# Patient Record
Sex: Female | Born: 1990 | Race: Asian | Hispanic: No | Smoking: Current every day smoker
Health system: Southern US, Community
[De-identification: ages and names within clinical notes are randomized; demographics above are authoritative.]

## PROBLEM LIST (undated history)

## (undated) DIAGNOSIS — G43909 Migraine, unspecified, not intractable, without status migrainosus: Secondary | ICD-10-CM

## (undated) DIAGNOSIS — Z862 Personal history of diseases of the blood and blood-forming organs and certain disorders involving the immune mechanism: Secondary | ICD-10-CM

## (undated) DIAGNOSIS — N200 Calculus of kidney: Secondary | ICD-10-CM

## (undated) DIAGNOSIS — B009 Herpesviral infection, unspecified: Secondary | ICD-10-CM

## (undated) HISTORY — DX: Migraine, unspecified, not intractable, without status migrainosus: G43.909

## (undated) HISTORY — PX: OTHER SURGICAL HISTORY: SHX169

## (undated) HISTORY — DX: Herpesviral infection, unspecified: B00.9

## (undated) HISTORY — DX: Calculus of kidney: N20.0

## (undated) HISTORY — PX: CHOLECYSTECTOMY: SHX55

## (undated) HISTORY — DX: Personal history of diseases of the blood and blood-forming organs and certain disorders involving the immune mechanism: Z86.2

---

## 2009-02-13 ENCOUNTER — Emergency Department: Payer: Self-pay | Admitting: Internal Medicine

## 2010-05-21 ENCOUNTER — Emergency Department: Payer: Self-pay | Admitting: Emergency Medicine

## 2011-02-13 ENCOUNTER — Emergency Department: Payer: Self-pay | Admitting: Emergency Medicine

## 2011-03-18 ENCOUNTER — Ambulatory Visit: Payer: Self-pay | Admitting: Obstetrics & Gynecology

## 2012-04-12 ENCOUNTER — Emergency Department: Payer: Self-pay | Admitting: Emergency Medicine

## 2013-01-20 ENCOUNTER — Emergency Department: Payer: Self-pay | Admitting: Emergency Medicine

## 2013-01-20 LAB — URINALYSIS, COMPLETE
Bacteria: NONE SEEN
Glucose,UR: NEGATIVE mg/dL (ref 0–75)
Ketone: NEGATIVE
Nitrite: NEGATIVE
Ph: 5 (ref 4.5–8.0)
Protein: 30
Specific Gravity: 1.025 (ref 1.003–1.030)
WBC UR: 16 /HPF (ref 0–5)

## 2014-03-11 ENCOUNTER — Emergency Department: Payer: Self-pay | Admitting: Emergency Medicine

## 2014-03-11 LAB — CBC
HCT: 35.6 % (ref 35.0–47.0)
HGB: 11.5 g/dL — AB (ref 12.0–16.0)
MCH: 20.4 pg — ABNORMAL LOW (ref 26.0–34.0)
MCHC: 32.3 g/dL (ref 32.0–36.0)
MCV: 63 fL — ABNORMAL LOW (ref 80–100)
PLATELETS: 291 10*3/uL (ref 150–440)
RBC: 5.65 10*6/uL — ABNORMAL HIGH (ref 3.80–5.20)
RDW: 15.7 % — AB (ref 11.5–14.5)
WBC: 15.5 10*3/uL — ABNORMAL HIGH (ref 3.6–11.0)

## 2014-03-11 LAB — BASIC METABOLIC PANEL
ANION GAP: 7 (ref 7–16)
BUN: 8 mg/dL (ref 7–18)
CALCIUM: 8.7 mg/dL (ref 8.5–10.1)
CHLORIDE: 105 mmol/L (ref 98–107)
CREATININE: 0.63 mg/dL (ref 0.60–1.30)
Co2: 26 mmol/L (ref 21–32)
EGFR (Non-African Amer.): >60
Glucose: 89 mg/dL (ref 65–99)
OSMOLALITY: 273 (ref 275–301)
POTASSIUM: 3.8 mmol/L (ref 3.5–5.1)
Sodium: 138 mmol/L (ref 136–145)

## 2014-03-11 LAB — TROPONIN I

## 2015-05-02 ENCOUNTER — Encounter: Payer: Self-pay | Admitting: Emergency Medicine

## 2015-05-02 DIAGNOSIS — K5900 Constipation, unspecified: Secondary | ICD-10-CM | POA: Insufficient documentation

## 2015-05-02 DIAGNOSIS — R51 Headache: Secondary | ICD-10-CM | POA: Insufficient documentation

## 2015-05-02 DIAGNOSIS — F17210 Nicotine dependence, cigarettes, uncomplicated: Secondary | ICD-10-CM | POA: Insufficient documentation

## 2015-05-02 DIAGNOSIS — N200 Calculus of kidney: Secondary | ICD-10-CM | POA: Insufficient documentation

## 2015-05-02 DIAGNOSIS — Z3202 Encounter for pregnancy test, result negative: Secondary | ICD-10-CM | POA: Insufficient documentation

## 2015-05-02 LAB — BASIC METABOLIC PANEL
Anion gap: 6 (ref 5–15)
BUN: 13 mg/dL (ref 6–20)
CHLORIDE: 105 mmol/L (ref 101–111)
CO2: 28 mmol/L (ref 22–32)
CREATININE: 0.93 mg/dL (ref 0.44–1.00)
Calcium: 9.1 mg/dL (ref 8.9–10.3)
GFR calc Af Amer: >60 mL/min (ref >60)
GFR calc non Af Amer: >60 mL/min (ref >60)
Glucose, Bld: 66 mg/dL (ref 65–99)
POTASSIUM: 3.3 mmol/L — AB (ref 3.5–5.1)
Sodium: 139 mmol/L (ref 135–145)

## 2015-05-02 LAB — URINALYSIS COMPLETE WITH MICROSCOPIC (ARMC ONLY)
BILIRUBIN URINE: NEGATIVE
Bacteria, UA: NONE SEEN
GLUCOSE, UA: NEGATIVE mg/dL
HGB URINE DIPSTICK: NEGATIVE
KETONES UR: NEGATIVE mg/dL
Leukocytes, UA: NEGATIVE
Nitrite: NEGATIVE
PH: 6 (ref 5.0–8.0)
Protein, ur: NEGATIVE mg/dL
SPECIFIC GRAVITY, URINE: 1.009 (ref 1.005–1.030)

## 2015-05-02 LAB — CBC WITH DIFFERENTIAL/PLATELET
Basophils Absolute: 0.1 10*3/uL (ref 0–0.1)
Basophils Relative: 1 %
EOS ABS: 0.1 10*3/uL (ref 0–0.7)
EOS PCT: 1 %
HCT: 35.3 % (ref 35.0–47.0)
HEMOGLOBIN: 11.5 g/dL — AB (ref 12.0–16.0)
LYMPHS ABS: 3.4 10*3/uL (ref 1.0–3.6)
LYMPHS PCT: 21 %
MCH: 19.3 pg — AB (ref 26.0–34.0)
MCHC: 32.6 g/dL (ref 32.0–36.0)
MCV: 59.4 fL — AB (ref 80.0–100.0)
MONOS PCT: 12 %
Monocytes Absolute: 2 10*3/uL — ABNORMAL HIGH (ref 0.2–0.9)
Neutro Abs: 10.4 10*3/uL — ABNORMAL HIGH (ref 1.4–6.5)
Neutrophils Relative %: 65 %
Platelets: 281 10*3/uL (ref 150–440)
RBC: 5.94 MIL/uL — ABNORMAL HIGH (ref 3.80–5.20)
RDW: 15.2 % — ABNORMAL HIGH (ref 11.5–14.5)
WBC: 15.9 10*3/uL — ABNORMAL HIGH (ref 3.6–11.0)

## 2015-05-02 LAB — POCT PREGNANCY, URINE: Preg Test, Ur: NEGATIVE

## 2015-05-02 NOTE — ED Notes (Signed)
Pt presents to ED with headache for the past 4 days, constipation for about 3-4 days, and lower abd cramping. Pt states she began vomiting last night around 2200 until around 7am. Started again later in the morning and hasn't stopped since then. Last time vomiting was approx 1 hour ago. Pt alert with dry mucous membranes.

## 2015-05-02 NOTE — ED Notes (Signed)
Pt unable to give urine sample at this time; pt has specimen cup. 

## 2015-05-03 ENCOUNTER — Emergency Department: Payer: Self-pay

## 2015-05-03 ENCOUNTER — Emergency Department
Admission: EM | Admit: 2015-05-03 | Discharge: 2015-05-03 | Disposition: A | Discharge status: Home / Self Care | Payer: Self-pay | Attending: Emergency Medicine | Admitting: Emergency Medicine

## 2015-05-03 DIAGNOSIS — R519 Headache, unspecified: Secondary | ICD-10-CM

## 2015-05-03 DIAGNOSIS — K5900 Constipation, unspecified: Secondary | ICD-10-CM

## 2015-05-03 DIAGNOSIS — N200 Calculus of kidney: Secondary | ICD-10-CM

## 2015-05-03 DIAGNOSIS — R51 Headache: Secondary | ICD-10-CM

## 2015-05-03 MED ORDER — MAGNESIUM CITRATE PO SOLN
1.0000 | Freq: Once | ORAL | Status: AC
  Administered 2015-05-03: 1 via ORAL
  Filled 2015-05-03: qty 296

## 2015-05-03 MED ORDER — IOHEXOL 300 MG/ML  SOLN
100.0000 mL | Freq: Once | INTRAMUSCULAR | Status: AC | PRN
  Administered 2015-05-03: 100 mL via INTRAVENOUS

## 2015-05-03 MED ORDER — IOHEXOL 240 MG/ML SOLN
25.0000 mL | Freq: Once | INTRAMUSCULAR | Status: AC | PRN
  Administered 2015-05-03: 25 mL via ORAL

## 2015-05-03 MED ORDER — SENNOSIDES-DOCUSATE SODIUM 8.6-50 MG PO TABS
2.0000 | ORAL_TABLET | Freq: Once | ORAL | Status: AC
  Administered 2015-05-03: 2 via ORAL
  Filled 2015-05-03: qty 2

## 2015-05-03 MED ORDER — ONDANSETRON 4 MG PO TBDP
4.0000 mg | ORAL_TABLET | Freq: Once | ORAL | Status: AC
  Administered 2015-05-03: 4 mg via ORAL
  Filled 2015-05-03: qty 1

## 2015-05-03 MED ORDER — KETOROLAC TROMETHAMINE 10 MG PO TABS: 10.0000 mg | ORAL_TABLET | Freq: Four times a day (QID) | ORAL | Status: DC | PRN

## 2015-05-03 MED ORDER — ONDANSETRON 4 MG PO TBDP: 4.0000 mg | ORAL_TABLET | Freq: Three times a day (TID) | ORAL | Status: DC | PRN

## 2015-05-03 MED ORDER — KETOROLAC TROMETHAMINE 10 MG PO TABS
10.0000 mg | ORAL_TABLET | Freq: Once | ORAL | Status: AC
  Administered 2015-05-03: 10 mg via ORAL
  Filled 2015-05-03: qty 1

## 2015-05-03 NOTE — ED Provider Notes (Signed)
[facility] Emergency Department Provider Note  ____________________________________________  Time seen: 1:00 AM  I have reviewed the triage vital signs and the nursing notes.   HISTORY  Chief Complaint Constipation; Headache; and Emesis     HPI Kimberly Carney is a 25 y.o. female presents with multiple complaints including migraine headache 4 days. Patient states she has a history of migraine headaches for which she takes Excedrin Migraine at home. Patient denies any weakness no numbness no gait instability or visual changes. Patient also admits to constipation 4 days patient states that she has a ongoing issue with constipation. States she has lower abdominal cramping. Patient also admits to low back pain patient states that she's had an ongoing issue with low back pain secondary to an injury which occurred at work approximately year ago. Current pain score 6 out of 10     Past medical history Migraine headaches There are no active problems to display for this patient.   Past surgical history None No current outpatient prescriptions on file.  Allergies No known drug allergies History reviewed. No pertinent family history.  Social History Social History  Substance Use Topics  . Smoking status: Current Every Day Smoker -- 0.50 packs/day    Types: Cigarettes  . Smokeless tobacco: Never Used  . Alcohol Use: Yes    Review of Systems  Constitutional: Negative for fever. Eyes: Negative for visual changes. ENT: Negative for sore throat. Cardiovascular: Negative for chest pain. Respiratory: Negative for shortness of breath. Gastrointestinal: Negative for abdominal pain, vomiting and diarrhea. Positive for constipation Genitourinary: Negative for dysuria. Musculoskeletal: Positive for back pain. Skin: Negative for rash. Neurological: Positive for "migraine headache"   10-point ROS otherwise  negative.  ____________________________________________   PHYSICAL EXAM:  VITAL SIGNS: ED Triage Vitals  Enc Vitals Group     BP 05/02/15 1941 122/78 mmHg     Pulse Rate 05/02/15 1941 92     Resp 05/02/15 1941 18     Temp 05/02/15 1941 98.5 F (36.9 C)     Temp Source 05/02/15 1941 Oral     SpO2 05/02/15 1941 100 %     Weight 05/02/15 1941 130 lb (58.968 kg)     Height 05/02/15 1941 5\' 4"  (1.626 m)     Head Cir --      Peak Flow --      Pain Score 05/02/15 1947 8     Pain Loc --      Pain Edu? --      Excl. in [place]? --      Constitutional: Alert and oriented. Well appearing and in no distress. Eyes: Conjunctivae are normal. PERRL. Normal extraocular movements. ENT   Head: Normocephalic and atraumatic.   Nose: No congestion/rhinnorhea.   Mouth/Throat: Mucous membranes are moist.   Neck: No stridor. Hematological/Lymphatic/Immunilogical: No cervical lymphadenopathy. Cardiovascular: Normal rate, regular rhythm. Normal and symmetric distal pulses are present in all extremities. No murmurs, rubs, or gallops. Respiratory: Normal respiratory effort without tachypnea nor retractions. Breath sounds are clear and equal bilaterally. No wheezes/rales/rhonchi. Gastrointestinal: Soft and nontender. No distention. There is no CVA tenderness. Genitourinary: deferred Musculoskeletal: Nontender with normal range of motion in all extremities. No joint effusions.  No lower extremity tenderness nor edema. Neurologic:  Normal speech and language. No gross focal neurologic deficits are appreciated. Speech is normal.  Skin:  Skin is warm, dry and intact. No rash noted. Psychiatric: Mood and affect are normal. Speech and behavior are normal. Patient exhibits appropriate insight  and judgment.  ____________________________________________    LABS (pertinent positives/negatives)  Labs Reviewed  BASIC METABOLIC PANEL - Abnormal; Notable for the following:    Potassium 3.3 (*)    All  other components within normal limits  CBC WITH DIFFERENTIAL/PLATELET - Abnormal; Notable for the following:    WBC 15.9 (*)    RBC 5.94 (*)    Hemoglobin 11.5 (*)    MCV 59.4 (*)    MCH 19.3 (*)    RDW 15.2 (*)    Neutro Abs 10.4 (*)    Monocytes Absolute 2.0 (*)    All other components within normal limits  URINALYSIS COMPLETEWITH MICROSCOPIC ([facility] ONLY) - Abnormal; Notable for the following:    Color, Urine YELLOW (*)    APPearance CLEAR (*)    Squamous Epithelial / LPF 0-5 (*)    All other components within normal limits  POC URINE PREG, ED  POCT PREGNANCY, URINE       RADIOLOGY  CT Abdomen Pelvis W Contrast (Final result) Result time: 05/03/15 02:29:26   Final result by Rad Results In Interface (05/03/15 02:29:26)   Narrative:   CLINICAL DATA: 25 year old female with lower abdominal pain.  EXAM: CT ABDOMEN AND PELVIS WITH CONTRAST  TECHNIQUE: Multidetector CT imaging of the abdomen and pelvis was performed using the standard protocol following bolus administration of intravenous contrast.  CONTRAST: 53mL OMNIPAQUE IOHEXOL 240 MG/ML SOLN, 188mL OMNIPAQUE IOHEXOL 300 MG/ML SOLN  COMPARISON: None.  FINDINGS: The visualized lung bases are clear. No intra-abdominal free air. Small free fluid within the pelvis.  There is a 1.0 x 0.9 cm hypodense lesion with nodular focus of enhancement in the right lobe of the liver which is incompletely characterized. This may represent a hemangioma. MRI is recommended for further characterization. The liver is otherwise unremarkable. The gallbladder, pancreas, spleen, and the adrenal glands appear unremarkable.  A 4 mm nonobstructing right renal inferior pole calculus. A 1.4 cm left renal hypodense lesion is not well characterized but likely represents a cyst. There is no hydronephrosis on either side. There is mild heterogeneous enhancement of the renal parenchyma with cortical striation concerning for  pyelonephritis. Correlation with urinalysis recommended. No drainable fluid collection/ abscess identified. There is no hydronephrosis on either side. The visualized ureters and urinary bladder appear unremarkable. The uterus is anteverted and grossly unremarkable.  There is moderate stool throughout the colon with no evidence of bowel obstruction or inflammation. Stop normal appendix.  The abdominal aorta and IVC appear unremarkable. No portal venous gas identified. There is no adenopathy. The abdominal wall soft tissues appear unremarkable with the osseous structures are intact.  IMPRESSION: Heterogeneous enhancement of the kidneys compatible with pyelonephritis. Correlation with urinalysis recommended. No abscess.  Nonobstructing 4 mm right renal inferior pole calculus. No hydronephrosis.  Probable right hepatic hemangioma. MRI is recommended for further characterization.   Electronically Signed By: Anner Crete M.D. On: 05/03/2015 02:29        INITIAL IMPRESSION / ASSESSMENT AND PLAN / ED COURSE  Pertinent labs & imaging results that were available during my care of the patient were reviewed by me and considered in my medical decision making (see chart for details).    ____________________________________________   FINAL CLINICAL IMPRESSION(S) / ED DIAGNOSES  Final diagnoses:  Constipation, unspecified constipation type  Acute nonintractable headache, unspecified headache type  Kidney stone      Gregor Hams, MD 05/03/15 0320

## 2015-05-03 NOTE — Discharge Instructions (Signed)
Constipation, Adult Constipation is when a person has fewer than three bowel movements a week, has difficulty having a bowel movement, or has stools that are dry, hard, or larger than normal. As people grow older, constipation is more common. A low-fiber diet, not taking in enough fluids, and taking certain medicines may make constipation worse.  CAUSES   Certain medicines, such as antidepressants, pain medicine, iron supplements, antacids, and water pills.   Certain diseases, such as diabetes, irritable bowel syndrome (IBS), thyroid disease, or depression.   Not drinking enough water.   Not eating enough fiber-rich foods.   Stress or travel.   Lack of physical activity or exercise.   Ignoring the urge to have a bowel movement.   Using laxatives too much.  SIGNS AND SYMPTOMS   Having fewer than three bowel movements a week.   Straining to have a bowel movement.   Having stools that are hard, dry, or larger than normal.   Feeling full or bloated.   Pain in the lower abdomen.   Not feeling relief after having a bowel movement.  DIAGNOSIS  Your health care provider will take a medical history and perform a physical exam. Further testing may be done for severe constipation. Some tests may include:  A barium enema X-ray to examine your rectum, colon, and, sometimes, your small intestine.   A sigmoidoscopy to examine your lower colon.   A colonoscopy to examine your entire colon. TREATMENT  Treatment will depend on the severity of your constipation and what is causing it. Some dietary treatments include drinking more fluids and eating more fiber-rich foods. Lifestyle treatments may include regular exercise. If these diet and lifestyle recommendations do not help, your health care provider may recommend taking over-the-counter laxative medicines to help you have bowel movements. Prescription medicines may be prescribed if over-the-counter medicines do not work.    HOME CARE INSTRUCTIONS   Eat foods that have a lot of fiber, such as fruits, vegetables, whole grains, and beans.  Limit foods high in fat and processed sugars, such as french fries, hamburgers, cookies, candies, and soda.   A fiber supplement may be added to your diet if you cannot get enough fiber from foods.   Drink enough fluids to keep your urine clear or pale yellow.   Exercise regularly or as directed by your health care provider.   Go to the restroom when you have the urge to go. Do not hold it.   Only take over-the-counter or prescription medicines as directed by your health care provider. Do not take other medicines for constipation without talking to your health care provider first.  [place] IF:   You have bright red blood in your stool.   Your constipation lasts for more than 4 days or gets worse.   You have abdominal or rectal pain.   You have thin, pencil-like stools.   You have unexplained weight loss. MAKE SURE YOU:   Understand these instructions.  Will watch your condition.  Will get help right away if you are not doing well or get worse.   This information is not intended to replace advice given to you by your health care provider. Make sure you discuss any questions you have with your health care provider.   Document Released: 12/28/2003 Document Revised: 04/21/2014 Document Reviewed: 01/10/2013 Elsevier Interactive Patient Education 2016 [place].  Kidney Stones Kidney stones (urolithiasis) are deposits that form inside your kidneys. The intense pain is caused by the  stone moving through the urinary tract. When the stone moves, the ureter goes into spasm around the stone. The stone is usually passed in the urine.  CAUSES   A disorder that makes certain neck glands produce too much parathyroid hormone (primary hyperparathyroidism).  A buildup of uric acid crystals, similar to gout in your joints.  Narrowing  (stricture) of the ureter.  A kidney obstruction present at birth (congenital obstruction).  Previous surgery on the kidney or ureters.  Numerous kidney infections. SYMPTOMS   Feeling sick to your stomach (nauseous).  Throwing up (vomiting).  Blood in the urine (hematuria).  Pain that usually spreads (radiates) to the groin.  Frequency or urgency of urination. DIAGNOSIS   Taking a history and physical exam.  Blood or urine tests.  CT scan.  Occasionally, an examination of the inside of the urinary bladder (cystoscopy) is performed. TREATMENT   Observation.  Increasing your fluid intake.  Extracorporeal shock wave lithotripsy--This is a noninvasive procedure that uses shock waves to break up kidney stones.  Surgery may be needed if you have severe pain or persistent obstruction. There are various surgical procedures. Most of the procedures are performed with the use of small instruments. Only small incisions are needed to accommodate these instruments, so recovery time is minimized. The size, location, and chemical composition are all important variables that will determine the proper choice of action for you. Talk to your health care provider to better understand your situation so that you will minimize the risk of injury to yourself and your kidney.  HOME CARE INSTRUCTIONS   Drink enough water and fluids to keep your urine clear or pale yellow. This will help you to pass the stone or stone fragments.  Strain all urine through the provided strainer. Keep all particulate matter and stones for your health care provider to see. The stone causing the pain may be as small as a grain of salt. It is very important to use the strainer each and every time you pass your urine. The collection of your stone will allow your health care provider to analyze it and verify that a stone has actually passed. The stone analysis will often identify what you can do to reduce the incidence of  recurrences.  Only take over-the-counter or prescription medicines for pain, discomfort, or fever as directed by your health care provider.  Keep all follow-up visits as told by your health care provider. This is important.  Get follow-up X-rays if required. The absence of pain does not always mean that the stone has passed. It may have only stopped moving. If the urine remains completely obstructed, it can cause loss of kidney function or even complete destruction of the kidney. It is your responsibility to make sure X-rays and follow-ups are completed. Ultrasounds of the kidney can show blockages and the status of the kidney. Ultrasounds are not associated with any radiation and can be performed easily in a matter of minutes.  Make changes to your daily diet as told by your health care provider. You may be told to:  Limit the amount of salt that you eat.  Eat 5 or more servings of fruits and vegetables each day.  Limit the amount of meat, poultry, fish, and eggs that you eat.  Collect a 24-hour urine sample as told by your health care provider.You may need to collect another urine sample every 6-12 months. SEEK MEDICAL CARE IF:  You experience pain that is progressive and unresponsive to any pain medicine  you have been prescribed. SEEK IMMEDIATE MEDICAL CARE IF:   Pain cannot be controlled with the prescribed medicine.  You have a fever or shaking chills.  The severity or intensity of pain increases over 18 hours and is not relieved by pain medicine.  You develop a new onset of abdominal pain.  You feel faint or pass out.  You are unable to urinate.   This information is not intended to replace advice given to you by your health care provider. Make sure you discuss any questions you have with your health care provider.   Document Released: 03/31/2005 Document Revised: 12/20/2014 Document Reviewed: 09/01/2012 Elsevier Interactive Patient Education 2016 [employer].  Migraine Headache A migraine headache is an intense, throbbing pain on one or both sides of your head. A migraine can last for 30 minutes to several hours. CAUSES  The exact cause of a migraine headache is not always known. However, a migraine may be caused when nerves in the brain become irritated and release chemicals that cause inflammation. This causes pain. Certain things may also trigger migraines, such as:  Alcohol.  Smoking.  Stress.  Menstruation.  Aged cheeses.  Foods or drinks that contain nitrates, glutamate, aspartame, or tyramine.  Lack of sleep.  Chocolate.  Caffeine.  Hunger.  Physical exertion.  Fatigue.  Medicines used to treat chest pain (nitroglycerine), birth control pills, estrogen, and some blood pressure medicines. SIGNS AND SYMPTOMS  Pain on one or both sides of your head.  Pulsating or throbbing pain.  Severe pain that prevents daily activities.  Pain that is aggravated by any physical activity.  Nausea, vomiting, or both.  Dizziness.  Pain with exposure to bright lights, loud noises, or activity.  General sensitivity to bright lights, loud noises, or smells. Before you get a migraine, you may get warning signs that a migraine is coming (aura). An aura may include:  Seeing flashing lights.  Seeing bright spots, halos, or zigzag lines.  Having tunnel vision or blurred vision.  Having feelings of numbness or tingling.  Having trouble talking.  Having muscle weakness. DIAGNOSIS  A migraine headache is often diagnosed based on:  Symptoms.  Physical exam.  A CT scan or MRI of your head. These imaging tests cannot diagnose migraines, but they can help rule out other causes of headaches. TREATMENT Medicines may be given for pain and nausea. Medicines can also be given to help prevent recurrent migraines.  HOME CARE INSTRUCTIONS  Only take over-the-counter or prescription medicines for pain or discomfort as directed by  your health care provider. The use of long-term narcotics is not recommended.  Lie down in a dark, quiet room when you have a migraine.  Keep a journal to find out what may trigger your migraine headaches. For example, write down:  What you eat and drink.  How much sleep you get.  Any change to your diet or medicines.  Limit alcohol consumption.  Quit smoking if you smoke.  Get 7-9 hours of sleep, or as recommended by your health care provider.  Limit stress.  Keep lights dim if bright lights bother you and make your migraines worse. SEEK IMMEDIATE MEDICAL CARE IF:   Your migraine becomes severe.  You have a fever.  You have a stiff neck.  You have vision loss.  You have muscular weakness or loss of muscle control.  You start losing your balance or have trouble walking.  You feel faint or pass out.  You have severe symptoms that  are different from your first symptoms. MAKE SURE YOU:   Understand these instructions.  Will watch your condition.  Will get help right away if you are not doing well or get worse.   This information is not intended to replace advice given to you by your health care provider. Make sure you discuss any questions you have with your health care provider.   Document Released: 03/31/2005 Document Revised: 04/21/2014 Document Reviewed: 12/06/2012 Elsevier Interactive Patient Education [employer].

## 2015-05-03 NOTE — ED Notes (Signed)
Finish drinking oral contrast. CT notified.

## 2015-05-03 NOTE — ED Notes (Signed)
Patient transported to CT 

## 2015-05-07 ENCOUNTER — Encounter: Payer: Self-pay | Admitting: Urology

## 2015-05-07 ENCOUNTER — Ambulatory Visit (INDEPENDENT_AMBULATORY_CARE_PROVIDER_SITE_OTHER): Payer: Self-pay | Admitting: Urology

## 2015-05-07 VITALS — BP 107/70 | HR 80 | Ht 64.0 in | Wt 135.5 lb

## 2015-05-07 DIAGNOSIS — M546 Pain in thoracic spine: Secondary | ICD-10-CM

## 2015-05-07 DIAGNOSIS — N289 Disorder of kidney and ureter, unspecified: Secondary | ICD-10-CM

## 2015-05-07 DIAGNOSIS — N200 Calculus of kidney: Secondary | ICD-10-CM

## 2015-05-07 DIAGNOSIS — K7689 Other specified diseases of liver: Secondary | ICD-10-CM

## 2015-05-07 DIAGNOSIS — N12 Tubulo-interstitial nephritis, not specified as acute or chronic: Secondary | ICD-10-CM

## 2015-05-07 DIAGNOSIS — K769 Liver disease, unspecified: Secondary | ICD-10-CM

## 2015-05-07 LAB — MICROSCOPIC EXAMINATION: RBC, UA: NONE SEEN /hpf (ref 0–?)

## 2015-05-07 LAB — URINALYSIS, COMPLETE
Bilirubin, UA: NEGATIVE
Glucose, UA: NEGATIVE
Ketones, UA: NEGATIVE
Nitrite, UA: NEGATIVE
PH UA: 8.5 — AB (ref 5.0–7.5)
Protein, UA: NEGATIVE
RBC UA: NEGATIVE
Specific Gravity, UA: 1.015 (ref 1.005–1.030)
Urobilinogen, Ur: 0.2 mg/dL (ref 0.2–1.0)

## 2015-05-07 NOTE — Progress Notes (Signed)
05/07/2015 3:32 PM   Kimberly Carney February 13, 1991 [id number]  Referring provider: No referring provider defined for this encounter.  Chief Complaint  Patient presents with  . Nephrolithiasis    Referral from ER    HPI: Patient is a 25 year old Asian female who was found to have a non obstructing 4 mm right renal inferior pole calculus during an ED visit for a migraine, nausea and vomiting.    Patient states that approximately 2 days ago she started with a migraine headache with associated nausea with vomiting.  She also was complaining of thoracic pain into her lower back. There is associated fatigue in her legs.    CT was performed as patient wanted an etiology for the nausea and vomiting. It demonstrated heterogeneous enhancement of the kidneys compatible with pyelonephritis, her urinalysis was unremarkable.  She was also found to have a nodular focus of enhancement of the liver which is incompletely uncharacterized and a 1.4 cm left renal hypodense that was not well characterized.  A non-obstructing right renal inferior pole calculus which is 4 mm in size.    2 weeks ago she had the onset of frequent urination, urgency, nocturia 3-4 times a night, 2 episodes of urinary incontinence and her stream starting and stopping.  She is sexually active. She's had 2 urinary tract infections over the last year.     Today, she is still having the back pain.  Her UA is unremarkable.      PMH: Past Medical History  Diagnosis Date  . Kidney stone     Surgical History: Past Surgical History  Procedure Laterality Date  . None      Home Medications:    Medication List       This list is accurate as of: 05/07/15  3:32 PM.  Always use your most recent med list.               ketorolac 10 MG tablet  Commonly known as:  TORADOL  Take 1 tablet (10 mg total) by mouth every 6 (six) hours as needed.     MAGNESIUM CITRATE PO  Take by mouth.     Norgestimate-Ethinyl Estradiol  Triphasic 0.18/0.215/0.25 MG-35 MCG tablet  Take by mouth. Reported on 05/07/2015     ondansetron 4 MG disintegrating tablet  Commonly known as:  ZOFRAN-ODT  Take 1 tablet (4 mg total) by mouth every 8 (eight) hours as needed for nausea or vomiting.     polyethylene glycol packet  Commonly known as:  MIRALAX / GLYCOLAX  Take by mouth. Reported on 05/07/2015        Allergies: No Known Allergies  Family History: Family History  Problem Relation Age of Onset  . Kidney disease Neg Hx   . Bladder Cancer Neg Hx     Social History:  reports that she has been smoking Cigarettes.  She has been smoking about 0.50 packs per day. She has never used smokeless tobacco. She reports that she drinks alcohol. She reports that she does not use illicit drugs.  ROS: UROLOGY Frequent Urination?: Yes Hard to postpone urination?: Yes Burning/pain with urination?: No Get up at night to urinate?: Yes Leakage of urine?: Yes Urine stream starts and stops?: Yes Trouble starting stream?: No Do you have to strain to urinate?: No Blood in urine?: No Urinary tract infection?: No Sexually transmitted disease?: No Injury to kidneys or bladder?: No Painful intercourse?: No Weak stream?: No Currently pregnant?: No Vaginal bleeding?: No Last menstrual  period?: 04/20/2015  Gastrointestinal Nausea?: Yes Vomiting?: Yes Indigestion/heartburn?: Yes Diarrhea?: No Constipation?: Yes  Constitutional Fever: No Night sweats?: No Weight loss?: No Fatigue?: Yes  Skin Skin rash/lesions?: No Itching?: No  Eyes Blurred vision?: Yes Double vision?: No  Ears/Nose/Throat Sore throat?: No Sinus problems?: No  Hematologic/Lymphatic Swollen glands?: No Easy bruising?: No  Cardiovascular Leg swelling?: No Chest pain?: No  Respiratory Cough?: No Shortness of breath?: No  Endocrine Excessive thirst?: Yes  Musculoskeletal Back pain?: Yes Joint pain?: No  Neurological Headaches?:  Yes Dizziness?: Yes  Psychologic Depression?: No Anxiety?: No  Physical Exam: BP 107/70 mmHg  Pulse 80  Ht 5\' 4"  (1.626 m)  Wt 135 lb 8 oz (61.462 kg)  BMI 23.25 kg/m2  LMP 04/20/2015  Constitutional: Well nourished. Alert and oriented, No acute distress. HEENT: [place] AT, moist mucus membranes. Trachea midline, no masses. Cardiovascular: No clubbing, cyanosis, or edema. Respiratory: Normal respiratory effort, no increased work of breathing. GI: Abdomen is soft, non tender, non distended, no abdominal masses. Liver and spleen not palpable.  No hernias appreciated.  Stool sample for occult testing is not indicated.   GU: No CVA tenderness.  No bladder fullness or masses.  Normal external genitalia, normal pubic hair distribution, no lesions.  Normal urethral meatus, no lesions, no prolapse, no discharge.   No urethral masses, tenderness and/or tenderness. No bladder fullness, tenderness or masses. Normal vagina mucosa, good estrogen effect, no discharge, no lesions, good pelvic support, no cystocele or rectocele noted.  No cervical motion tenderness.  Uterus is freely mobile and non-fixed.  No adnexal/parametria masses or tenderness noted.  Anus and perineum are without rashes or lesions.    Skin: No rashes, bruises or suspicious lesions. Lymph: No cervical or inguinal adenopathy. Neurologic: Grossly intact, no focal deficits, moving all 4 extremities. Psychiatric: Normal mood and affect.  Laboratory Data: Lab Results  Component Value Date   WBC 15.9* 05/02/2015   HGB 11.5* 05/02/2015   HCT 35.3 05/02/2015   MCV 59.4* 05/02/2015   PLT 281 05/02/2015    Lab Results  Component Value Date   CREATININE 0.93 05/02/2015    Urinalysis Results for orders placed or performed in visit on 05/07/15  CULTURE, URINE COMPREHENSIVE  Result Value Ref Range   Urine Culture, Comprehensive Final report    Result 1 Lactobacillus species   Microscopic Examination  Result Value Ref Range   WBC,  UA 6-10 (A) 0 -  5 /hpf   RBC, UA None seen 0 -  2 /hpf   Epithelial Cells (non renal) 0-10 0 - 10 /hpf   Crystals Present (A) N/A   Crystal Type Amorphous Sediment N/A   Bacteria, UA Few (A) None seen/Few  Urinalysis, Complete  Result Value Ref Range   Specific Gravity, UA 1.015 1.005 - 1.030   pH, UA 8.5 (H) 5.0 - 7.5   Color, UA Yellow Yellow   Appearance Ur Cloudy (A) Clear   Leukocytes, UA 1+ (A) Negative   Protein, UA Negative Negative/Trace   Glucose, UA Negative Negative   Ketones, UA Negative Negative   RBC, UA Negative Negative   Bilirubin, UA Negative Negative   Urobilinogen, Ur 0.2 0.2 - 1.0 mg/dL   Nitrite, UA Negative Negative   Microscopic Examination See below:   hCG, serum, qualitative  Result Value Ref Range   hCG,Beta Subunit,Qual,Serum Negative Negative <6 mIU/mL    Pertinent Imaging: CLINICAL DATA: 25 year old female with lower abdominal pain.  EXAM: CT ABDOMEN AND PELVIS WITH  CONTRAST  TECHNIQUE: Multidetector CT imaging of the abdomen and pelvis was performed using the standard protocol following bolus administration of intravenous contrast.  CONTRAST: 35mL OMNIPAQUE IOHEXOL 240 MG/ML SOLN, 11mL OMNIPAQUE IOHEXOL 300 MG/ML SOLN  COMPARISON: None.  FINDINGS: The visualized lung bases are clear. No intra-abdominal free air. Small free fluid within the pelvis.  There is a 1.0 x 0.9 cm hypodense lesion with nodular focus of enhancement in the right lobe of the liver which is incompletely characterized. This may represent a hemangioma. MRI is recommended for further characterization. The liver is otherwise unremarkable. The gallbladder, pancreas, spleen, and the adrenal glands appear unremarkable.  A 4 mm nonobstructing right renal inferior pole calculus. A 1.4 cm left renal hypodense lesion is not well characterized but likely represents a cyst. There is no hydronephrosis on either side. There is mild heterogeneous enhancement of  the renal parenchyma with cortical striation concerning for pyelonephritis. Correlation with urinalysis recommended. No drainable fluid collection/ abscess identified. There is no hydronephrosis on either side. The visualized ureters and urinary bladder appear unremarkable. The uterus is anteverted and grossly unremarkable.  There is moderate stool throughout the colon with no evidence of bowel obstruction or inflammation. Stop normal appendix.  The abdominal aorta and IVC appear unremarkable. No portal venous gas identified. There is no adenopathy. The abdominal wall soft tissues appear unremarkable with the osseous structures are intact.  IMPRESSION: Heterogeneous enhancement of the kidneys compatible with pyelonephritis. Correlation with urinalysis recommended. No abscess.  Nonobstructing 4 mm right renal inferior pole calculus. No hydronephrosis.  Probable right hepatic hemangioma. MRI is recommended for further characterization.   Electronically Signed  By: Anner Crete M.D.  On: 05/03/2015 02:29        Assessment & Plan:    1. Back pain:   Patient is experiencing back pain that is mostly likely unrelated to the right non-obstructing stone.  She will be undergoing a MRI of the abdomen for another evaluation of her liver and kidney lesion.  It may demonstrate some musculoskeletal issues with her thoracic spine.    2. Kidney stones:   Stone is non-obstructing at this point.  We will continue to monitor with yearly KUB's.  She will notify us of any right flank pain, hematuria, fevers or chills.    3. Pyelonephritis:  Heterogeneous enhancement of the kidneys seen on CT.  UA was unremarkable in the ED.  Her UA was unremarkable on today's exam.  I will send it for culture.  Her WBC's were elevated in the ED, but the elevation is stable from last years blood work.  - Urinalysis, Complete - CULTURE, URINE COMPREHENSIVE  4. Left renal lesion:  Most likely a  cyst.  She will undergoing an MRI for further evaluation of the hypodense lesion in the right lobe of her liver. This will allow another imaging study of the lesion in the kidney.  5. Liver mass:  Patient will be undergoing an MRI for further evaluation of this lesion.   Return in about 1 week (around 05/14/2015) for MRI report.  These notes generated with voice recognition software. I apologize for typographical errors.  Zara Council, [facility] [address], [place], [place] [address] [phone]

## 2015-05-08 LAB — HCG, SERUM, QUALITATIVE: hCG,Beta Subunit,Qual,Serum: NEGATIVE m[IU]/mL (ref <6)

## 2015-05-09 LAB — CULTURE, URINE COMPREHENSIVE

## 2015-05-10 DIAGNOSIS — K769 Liver disease, unspecified: Secondary | ICD-10-CM | POA: Insufficient documentation

## 2015-05-10 DIAGNOSIS — M549 Dorsalgia, unspecified: Secondary | ICD-10-CM | POA: Insufficient documentation

## 2015-05-10 DIAGNOSIS — N200 Calculus of kidney: Secondary | ICD-10-CM | POA: Insufficient documentation

## 2015-05-10 DIAGNOSIS — N289 Disorder of kidney and ureter, unspecified: Secondary | ICD-10-CM | POA: Insufficient documentation

## 2015-05-10 DIAGNOSIS — N12 Tubulo-interstitial nephritis, not specified as acute or chronic: Secondary | ICD-10-CM | POA: Insufficient documentation

## 2015-05-17 ENCOUNTER — Ambulatory Visit
Admission: RE | Admit: 2015-05-17 | Discharge: 2015-05-17 | Disposition: A | Discharge status: Home / Self Care | Payer: BLUE CROSS/BLUE SHIELD | Source: Ambulatory Visit | Attending: Urology | Admitting: Urology

## 2015-05-17 DIAGNOSIS — D1803 Hemangioma of intra-abdominal structures: Secondary | ICD-10-CM | POA: Insufficient documentation

## 2015-05-17 DIAGNOSIS — K7689 Other specified diseases of liver: Secondary | ICD-10-CM | POA: Diagnosis not present

## 2015-05-17 DIAGNOSIS — K769 Liver disease, unspecified: Secondary | ICD-10-CM

## 2015-05-17 MED ORDER — GADOXETATE DISODIUM 0.25 MMOL/ML IV SOLN
6.0000 mL | Freq: Once | INTRAVENOUS | Status: AC | PRN
  Administered 2015-05-17: 10 mL via INTRAVENOUS

## 2015-05-21 ENCOUNTER — Telehealth: Payer: Self-pay | Admitting: *Deleted

## 2015-05-21 ENCOUNTER — Ambulatory Visit: Payer: Self-pay | Admitting: Urology

## 2015-05-21 ENCOUNTER — Telehealth: Payer: Self-pay

## 2015-05-21 NOTE — Telephone Encounter (Signed)
-----   Message from Nori Riis, PA-C sent at 05/21/2015 12:51 PM EST ----- Patient's MRI demonstrates a hemangioma in the liver, which is a benign process.  We need to see her in one year with KUB and office visit.

## 2015-05-21 NOTE — Telephone Encounter (Signed)
Spoke with pt in reference to hemangioma. Made aware benign, KUB in 1 year and office visit. Pt voiced understanding.

## 2015-05-21 NOTE — Telephone Encounter (Signed)
Patient called stating she was going to be late for her appointment. Per Kimberly Carney ok to call patient and let her know the MRI results were normal and if patient still has concerns she can schedule another office visit. Patient states ok.

## 2015-10-17 ENCOUNTER — Encounter: Payer: Self-pay | Admitting: *Deleted

## 2015-10-17 ENCOUNTER — Emergency Department
Admission: EM | Admit: 2015-10-17 | Discharge: 2015-10-17 | Disposition: A | Discharge status: Home / Self Care | Payer: BLUE CROSS/BLUE SHIELD | Attending: Emergency Medicine | Admitting: Emergency Medicine

## 2015-10-17 DIAGNOSIS — T63461A Toxic effect of venom of wasps, accidental (unintentional), initial encounter: Secondary | ICD-10-CM | POA: Insufficient documentation

## 2015-10-17 DIAGNOSIS — F17210 Nicotine dependence, cigarettes, uncomplicated: Secondary | ICD-10-CM | POA: Insufficient documentation

## 2015-10-17 MED ORDER — FAMOTIDINE 20 MG PO TABS
40.0000 mg | ORAL_TABLET | Freq: Once | ORAL | Status: AC
  Administered 2015-10-17: 40 mg via ORAL
  Filled 2015-10-17: qty 2

## 2015-10-17 MED ORDER — TRIAMCINOLONE ACETONIDE 0.1 % EX OINT: 1.0000 "application " | TOPICAL_OINTMENT | Freq: Three times a day (TID) | CUTANEOUS | Status: DC

## 2015-10-17 MED ORDER — RANITIDINE HCL 150 MG PO TABS: 150.0000 mg | ORAL_TABLET | Freq: Two times a day (BID) | ORAL | Status: DC

## 2015-10-17 NOTE — ED Provider Notes (Signed)
[facility] Emergency Department Provider Note ____________________________________________  Time seen: 1931  I have reviewed the triage vital signs and the nursing notes.  HISTORY  Chief Complaint  Insect Bite  HPI Adaline Tuchman is a 25 y.o. female presents to the ED today status post a right upper arm wasp sting 2. She denies any other injuries and she denies any significant or subsequent numbness, tingling, swelling or difficulty breathing. She reportsthat she has taken Zyrtec but has not had any significant relief to the itching. Denies any fevers, chills, sweats. She denies any history of anaphylaxis to bee or wasp stings.  Past Medical History  Diagnosis Date  . Kidney stone     Patient Active Problem List   Diagnosis Date Noted  . Kidney stones 05/10/2015  . Liver lesion 05/10/2015  . 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 Renal lesion 05/10/2015  . Pyelonephritis 05/10/2015    Past Surgical History  Procedure Laterality Date  . None      Current Outpatient Rx  Name  Route  Sig  Dispense  Refill  . ketorolac (TORADOL) 10 MG tablet   Oral   Take 1 tablet (10 mg total) by mouth every 6 (six) hours as needed.   20 tablet   0   . MAGNESIUM CITRATE PO   Oral   Take by mouth.         . Norgestimate-Ethinyl Estradiol Triphasic 0.18/0.215/0.25 MG-35 MCG tablet   Oral   Take by mouth. Reported on 05/07/2015         . ondansetron (ZOFRAN-ODT) 4 MG disintegrating tablet   Oral   Take 1 tablet (4 mg total) by mouth every 8 (eight) hours as needed for nausea or vomiting. Patient not taking: Reported on 05/07/2015   20 tablet   0   . polyethylene glycol (MIRALAX / GLYCOLAX) packet   Oral   Take by mouth. Reported on 05/07/2015         . ranitidine (ZANTAC) 150 MG tablet   Oral   Take 1 tablet (150 mg total) by mouth 2 (two) times daily.   20 tablet   0   . triamcinolone ointment (KENALOG) 0.1 %   Topical   Apply 1 application topically 3  (three) times daily.   30 g   0     Allergies Review of patient's allergies indicates no known allergies.  Family History  Problem Relation Age of Onset  . Kidney disease Neg Hx   . Bladder Cancer Neg Hx     Social History Social History  Substance Use Topics  . Smoking status: Current Every Day Smoker -- 0.50 packs/day    Types: Cigarettes  . Smokeless tobacco: Never Used  . Alcohol Use: Yes   Review of Systems  Constitutional: Negative for fever. Eyes: Negative for visual changes. ENT: Negative for sore throat. Cardiovascular: Negative for chest pain. Respiratory: Negative for shortness of breath. Gastrointestinal: Negative for abdominal pain, vomiting and diarrhea. Skin: Negative for rash. Local redness and swelling to the right forearm. Neurological: Negative for headaches, focal weakness or numbness. ____________________________________________  PHYSICAL EXAM:  VITAL SIGNS: ED Triage Vitals  Enc Vitals Group     BP 10/17/15 1826 104/70 mmHg     Pulse Rate 10/17/15 1826 100     Resp 10/17/15 1826 18     Temp 10/17/15 1826 98.5 F (36.9 C)     Temp Source 10/17/15 1826 Oral     SpO2 10/17/15 1826 100 %  Weight 10/17/15 1826 130 lb (58.968 kg)     Height 10/17/15 1826 5\' 5"  (1.651 m)     Head Cir --      Peak Flow --      Pain Score 10/17/15 1827 6     Pain Loc --      Pain Edu? --      Excl. in [place]? --    Constitutional: Alert and oriented. Well appearing and in no distress. Head: Normocephalic and atraumatic. Eyes: Conjunctivae are normal. PERRL. Normal extraocular movements Mouth/Throat: Mucous membranes are moist. Cardiovascular: Normal rate, regular rhythm. Normal distal pulses and cap refill Respiratory: Normal respiratory effort. No wheezes/rales/rhonchi. Musculoskeletal: Normal composite fist. Nontender with normal range of motion in all extremities.  Neurologic: No gross focal neurologic deficits are appreciated. Skin:  Skin is warm, dry  and intact. No rash noted. Patient with 2 distinct wasp stings to the lateral aspect of the right forearm. She is also without significant local erythema and edema proximally. There is mild increased warmth and induration to the area. ____________________________________________  PROCEDURES  Famotidine 40 mg PO ____________________________________________  INITIAL IMPRESSION / ASSESSMENT AND PLAN / ED COURSE  Patient with a local reaction to 2 wasp stings to the right upper extremity. She has no indication or history of anaphylaxis. Patient will be discharged with a prescription for ranitidine as well as topical ointment. She is encouraged to continue to dose her over-the-counter Benadryl for additional histamine blockade. She should apply cool compresses to the forearm and follow with primary care provider. ____________________________________________  FINAL CLINICAL IMPRESSION(S) / ED DIAGNOSES  Final diagnoses:  Wasp sting, accidental or unintentional, initial encounter     Melvenia Needles, PA-C 10/17/15 [place], MD 10/26/15 1414

## 2015-10-17 NOTE — Discharge Instructions (Signed)
Bee, Wasp, or [employer], wasps, and hornets are part of a family of insects that can sting people. These stings can cause pain and inflammation, but they are usually not serious. However, some people may have an allergic reaction to a sting. This can cause the symptoms to be more severe.  SYMPTOMS  Common symptoms of this condition include:   A red lump in the skin that sometimes has a tiny hole in the center. In some cases, a stinger may be in the center of the wound.  Pain and itching at the sting site.  Redness and swelling around the sting site. If you have an allergic reaction (localized allergic reaction), the swelling and redness may spread out from the sting site. In some cases, this reaction can continue to develop over the next 12-36 hours. In rare cases, a person may have a severe allergic reaction (anaphylactic reaction) to a sting. Symptoms of an anaphylactic reaction may include:   Wheezing or difficulty breathing.  Raised, itchy, red patches on the skin.  Nausea or vomiting.  Abdominal cramping.  Diarrhea.  Chest pain.  Fainting.  Redness of the face (flushing). DIAGNOSIS  This condition is usually diagnosed based on symptoms, medical history, and a physical exam. TREATMENT  Most stings can be treated with:   Icing to reduce swelling.  Medicines (antihistamines) to treat itching or an allergic reaction.  Medicines to help reduce pain. These may be medicines that you take by mouth, or medicated creams or lotions that you apply to your skin. If you were stung by a bee, the stinger and a small sac of poison may be in the wound. This may be removed by brushing across it with a flat card, such as a credit card. Another method is to pinch the area and pull it out. These methods can help reduce the severity of the body's reaction to the sting.  HOME CARE INSTRUCTIONS   Wash the sting site daily with soap and water as told by your health care provider.  Apply  or take over-the-counter and prescription medicines only as told by your health care provider.  If directed, apply ice to the sting area.  Put ice in a plastic bag.  Place a towel between your skin and the bag.  Leave the ice on for 20 minutes, 2-3 times per day.  Do not scratch the sting area.  To lessen pain, try using a paste that is made of water and baking soda. Rub the paste on the sting area and leave it on for 5 minutes.  If you had a severe allergic reaction to a sting, you may need:  To wear a medical bracelet or necklace that lists the allergy.  To learn when and how to use an anaphylaxis kit or epinephrine injection. Your family members may also need to learn this.  To carry an anaphylaxis kit with you at all times. SEEK MEDICAL CARE IF:   Your symptoms do not get better in 2-3 days.  You have redness, swelling, or pain that spreads beyond the area of the sting.  You have a fever. SEEK IMMEDIATE MEDICAL CARE IF:  You have symptoms of a severe allergic reaction. These include:   Wheezing or difficulty breathing.  Chest pain.  Light-headedness or fainting.  Itchy, raised, red patches on the skin.  Nausea or vomiting.  Abdominal cramping.  Diarrhea.   This information is not intended to replace advice given to you by your health care provider.  Make sure you discuss any questions you have with your health care provider.   Document Released: 03/31/2005 Document Revised: 12/20/2014 Document Reviewed: 08/16/2014 Elsevier Interactive Patient Education 2016 [place] appear to have a local reaction to a wasp sting. Take OTC Benadryl (diphenhydramine) for additional itch relief. You should take the prescription meds as directed. Apply ice to reduce symptoms. Follow-up with [facility] for continued symptoms.

## 2015-10-17 NOTE — ED Notes (Signed)
Pt in via triage with complaints of wasp sting x2 to right upper forearm x 2 days ago.  Pt reports pain, itching, numbness/tingling radiating down into hand.  Redness, swelling noted to right forearm.  Pt A/Ox4, no immediate distress at this time.

## 2015-10-17 NOTE — ED Notes (Signed)
States she was stung by a wasp on her right arm 2 days ago, states she has taken zyrtec but itching is continued, redness noted

## 2016-02-20 ENCOUNTER — Emergency Department: Payer: Self-pay

## 2016-02-20 ENCOUNTER — Emergency Department
Admission: EM | Admit: 2016-02-20 | Discharge: 2016-02-20 | Disposition: A | Discharge status: Home / Self Care | Payer: Self-pay | Attending: Emergency Medicine | Admitting: Emergency Medicine

## 2016-02-20 DIAGNOSIS — R102 Pelvic and perineal pain: Secondary | ICD-10-CM | POA: Insufficient documentation

## 2016-02-20 DIAGNOSIS — N201 Calculus of ureter: Secondary | ICD-10-CM | POA: Insufficient documentation

## 2016-02-20 DIAGNOSIS — F17210 Nicotine dependence, cigarettes, uncomplicated: Secondary | ICD-10-CM | POA: Insufficient documentation

## 2016-02-20 DIAGNOSIS — N200 Calculus of kidney: Secondary | ICD-10-CM

## 2016-02-20 DIAGNOSIS — R109 Unspecified abdominal pain: Secondary | ICD-10-CM

## 2016-02-20 LAB — CBC
HCT: 36.9 % (ref 35.0–47.0)
Hemoglobin: 12.4 g/dL (ref 12.0–16.0)
MCH: 20 pg — AB (ref 26.0–34.0)
MCHC: 33.6 g/dL (ref 32.0–36.0)
MCV: 59.5 fL — AB (ref 80.0–100.0)
PLATELETS: 279 10*3/uL (ref 150–440)
RBC: 6.2 MIL/uL — AB (ref 3.80–5.20)
RDW: 15.6 % — ABNORMAL HIGH (ref 11.5–14.5)
WBC: 13.6 10*3/uL — ABNORMAL HIGH (ref 3.6–11.0)

## 2016-02-20 LAB — HCG, QUANTITATIVE, PREGNANCY: hCG, Beta Chain, Quant, S: <1 m[IU]/mL (ref <5)

## 2016-02-20 LAB — COMPREHENSIVE METABOLIC PANEL
ALT: 46 U/L (ref 14–54)
AST: 37 U/L (ref 15–41)
Albumin: 4.5 g/dL (ref 3.5–5.0)
Alkaline Phosphatase: 57 U/L (ref 38–126)
Anion gap: 9 (ref 5–15)
BUN: 12 mg/dL (ref 6–20)
CO2: 23 mmol/L (ref 22–32)
Calcium: 9 mg/dL (ref 8.9–10.3)
Chloride: 106 mmol/L (ref 101–111)
Creatinine, Ser: 0.76 mg/dL (ref 0.44–1.00)
GFR calc Af Amer: >60 mL/min (ref >60)
GFR calc non Af Amer: >60 mL/min (ref >60)
Glucose, Bld: 99 mg/dL (ref 65–99)
Potassium: 4 mmol/L (ref 3.5–5.1)
Sodium: 138 mmol/L (ref 135–145)
Total Bilirubin: 0.7 mg/dL (ref 0.3–1.2)
Total Protein: 8 g/dL (ref 6.5–8.1)

## 2016-02-20 LAB — URINALYSIS COMPLETE WITH MICROSCOPIC (ARMC ONLY)
BILIRUBIN URINE: NEGATIVE
GLUCOSE, UA: NEGATIVE mg/dL
KETONES UR: NEGATIVE mg/dL
Leukocytes, UA: NEGATIVE
Nitrite: NEGATIVE
PH: 6 (ref 5.0–8.0)
Protein, ur: NEGATIVE mg/dL
Specific Gravity, Urine: 1.011 (ref 1.005–1.030)

## 2016-02-20 LAB — LIPASE, BLOOD: Lipase: 28 U/L (ref 11–51)

## 2016-02-20 MED ORDER — OXYCODONE-ACETAMINOPHEN 5-325 MG PO TABS: 1.0000 | ORAL_TABLET | Freq: Four times a day (QID) | ORAL | 0 refills | Status: DC | PRN

## 2016-02-20 MED ORDER — ONDANSETRON HCL 4 MG/2ML IJ SOLN
4.0000 mg | Freq: Once | INTRAMUSCULAR | Status: AC
  Administered 2016-02-20: 4 mg via INTRAVENOUS

## 2016-02-20 MED ORDER — TAMSULOSIN HCL 0.4 MG PO CAPS: 0.4000 mg | ORAL_CAPSULE | Freq: Every day | ORAL | 0 refills | Status: DC

## 2016-02-20 MED ORDER — MORPHINE SULFATE (PF) 4 MG/ML IV SOLN
4.0000 mg | Freq: Once | INTRAVENOUS | Status: AC
  Administered 2016-02-20: 4 mg via INTRAVENOUS

## 2016-02-20 MED ORDER — ONDANSETRON HCL 4 MG/2ML IJ SOLN
INTRAMUSCULAR | Status: AC
  Administered 2016-02-20: 4 mg via INTRAVENOUS
  Filled 2016-02-20: qty 2

## 2016-02-20 MED ORDER — ONDANSETRON 4 MG PO TBDP: 4.0000 mg | ORAL_TABLET | Freq: Three times a day (TID) | ORAL | 0 refills | Status: DC | PRN

## 2016-02-20 MED ORDER — MORPHINE SULFATE (PF) 4 MG/ML IV SOLN
INTRAVENOUS | Status: AC
  Administered 2016-02-20: 4 mg via INTRAVENOUS
  Filled 2016-02-20: qty 1

## 2016-02-20 MED ORDER — MORPHINE SULFATE (PF) 4 MG/ML IV SOLN
2.0000 mg | Freq: Once | INTRAVENOUS | Status: AC
  Administered 2016-02-20: 2 mg via INTRAVENOUS
  Filled 2016-02-20: qty 1

## 2016-02-20 NOTE — ED Notes (Signed)
Pt alert and oriented X4, active, cooperative, pt in NAD. RR even and unlabored, color WNL.  Pt informed to return if any life threatening symptoms occur.   

## 2016-02-20 NOTE — ED Provider Notes (Signed)
[facility] Emergency Department Provider Note   ____________________________________________   First MD Initiated Contact with Patient 02/20/16 [phone]     (approximate)  I have reviewed the triage vital signs and the nursing notes.   HISTORY  Chief Complaint Flank Pain    HPI Kimberly Carney is a 25 y.o. female here for evaluation of sudden onset of severe right flank pain starting approximately 45 minutes ago. Patient awoke from sleep with severe pain in the right mid to right upper flank that radiates down towards her right lower abdomen. No associated vaginal bleeding or discharge. She denies any previous medical history. She denies any fevers or chills nausea or vomiting.  She has not noticed any change in urination.  10/10 severe right flank pain.  Past Medical History:  Diagnosis Date  . Kidney stone     Patient Active Problem List   Diagnosis Date Noted  . Kidney stones 05/10/2015  . Liver lesion 05/10/2015  . 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 Renal lesion 05/10/2015  . Pyelonephritis 05/10/2015    Past Surgical History:  Procedure Laterality Date  . none      Prior to Admission medications   Medication Sig Start Date End Date Taking? Authorizing Provider  Norgestimate-Ethinyl Estradiol Triphasic 0.18/0.215/0.25 MG-35 MCG tablet Take 1 tablet by mouth daily. Reported on 05/07/2015   Yes Historical Provider, MD  polyethylene glycol (MIRALAX / GLYCOLAX) packet Take 17 g by mouth daily as needed. Reported on 05/07/2015   Yes Historical Provider, MD  ranitidine (ZANTAC) 150 MG tablet Take 1 tablet (150 mg total) by mouth 2 (two) times daily. 10/17/15  Yes Melvenia Needles, PA-C  Patient reports presently not taking any medicine  Allergies Patient has no known allergies.  Family History  Problem Relation Age of Onset  . Kidney disease Neg Hx   . Bladder Cancer Neg Hx     Social History Social History  Substance Use Topics  . Smoking  status: Current Every Day Smoker    Packs/day: 0.50    Types: Cigarettes  . Smokeless tobacco: Never Used  . Alcohol use Yes    Review of Systems Constitutional: No fever/chills Eyes: No visual changes. ENT: No sore throat. Cardiovascular: Denies chest pain. Respiratory: Denies shortness of breath. Gastrointestinal: Severe right flank pain  and vomited once due to pain  No diarrhea.  No constipation. Genitourinary: Negative for dysuria. Denies vaginal discharge or pelvic discomfort. Musculoskeletal: Negative for back pain. Skin: Negative for rash. Neurological: Negative for headaches, focal weakness or numbness.  10-point ROS otherwise negative.  ____________________________________________   PHYSICAL EXAM:  VITAL SIGNS: ED Triage Vitals [02/20/16 0621]  Enc Vitals Group     BP 127/64     Pulse Rate 74     Resp 18     Temp 98.1 F (36.7 C)     Temp Source Oral     SpO2 100 %     Weight 138 lb (62.6 kg)     Height 5\' 4"  (1.626 m)     Head Circumference      Peak Flow      Pain Score 10     Pain Loc      Pain Edu?      Excl. in [place]?     Constitutional: Alert and oriented. Appears in severe pain, sitting upright, holding right hand over flank looking very uncomfortable. Eyes: Conjunctivae are normal. PERRL. EOMI. Head: Atraumatic. Nose: No congestion/rhinnorhea. Mouth/Throat: Mucous membranes are moist.  Oropharynx non-erythematous. Neck: No stridor.   Cardiovascular: Normal rate, regular rhythm. Grossly normal heart sounds.  Good peripheral circulation. Respiratory: Normal respiratory effort.  No retractions. Lungs CTAB. Gastrointestinal: Soft and nontender except for modest tenderness in the right flank. No distention. No abdominal bruits. Severe CVA tenderness on the right, none on the left Musculoskeletal: No lower extremity tenderness nor edema.  No joint effusions. Neurologic:  Normal speech and language. No gross focal neurologic deficits are appreciated.    Skin:  Skin is warm, dry and intact. No rash noted. Psychiatric: Mood and affect are normal. Speech and behavior are normal.  ____________________________________________   LABS (all labs ordered are listed, but only abnormal results are displayed)  Labs Reviewed  CBC - Abnormal; Notable for the following:       Result Value   WBC 13.6 (*)    RBC 6.20 (*)    MCV 59.5 (*)    MCH 20.0 (*)    RDW 15.6 (*)    All other components within normal limits  HCG, QUANTITATIVE, PREGNANCY  COMPREHENSIVE METABOLIC PANEL  LIPASE, BLOOD  URINALYSIS COMPLETEWITH MICROSCOPIC ([facility] ONLY)   ____________________________________________  EKG   ____________________________________________  RADIOLOGY   ____________________________________________   PROCEDURES  Procedure(s) performed: None  Procedures  Critical Care performed: No  ____________________________________________   INITIAL IMPRESSION / ASSESSMENT AND PLAN / ED COURSE  Pertinent labs & imaging results that were available during my care of the patient were reviewed by me and considered in my medical decision making (see chart for details).  Differential diagnosis includes but is not limited to, abdominal perforation, aortic dissection, cholecystitis, appendicitis, diverticulitis, colitis, esophagitis/gastritis, kidney stone, pyelonephritis, urinary tract infection, aortic aneurysm. All are considered in decision and treatment plan. Based upon the patient's presentation and risk factors, patient's previous medical history, I am very suspecting of a possible kidney stone on the right however differential diagnosis also includes ovarian processes and torsion though felt to be far less likely given today's presentation. She does not have any infectious symptoms, but does have mild leukocytosis.  ----------------------------------------- 8:02 AM on 02/20/2016 -----------------------------------------  Ongoing Care assigned to  [facility] Patient's pain presently much improved. Follow-up urinalysis and CT, suspect likely right-sided kidney stone but also consideration for urinary infection, other acute intra-abdominal etiology, and gynecologic process considered. CT does not elucidate a clear etiology, would recommend transvaginal ultrasound to further evaluate for ovarian pathology though again I suspect likely kidney stone. Consider antibiotics if any question of infection on urinalysis though the patient presently does not exhibit any infectious symptoms but does carry a previous history of pyelonephritis.  Clinical Course      ____________________________________________   FINAL CLINICAL IMPRESSION(S) / ED DIAGNOSES  Final diagnoses:  Flank pain  Right flank pain      NEW MEDICATIONS STARTED DURING THIS VISIT:  New Prescriptions   No medications on file     Note:  This document was prepared using Dragon voice recognition software and may include unintentional dictation errors.     Delman Kitten, MD 02/20/16 [phone]

## 2016-02-20 NOTE — ED Provider Notes (Signed)
-----------------------------------------   9:13 AM on 02/20/2016 -----------------------------------------  Patient care assumed from Dr. Jacqualine Code. Urinalysis is resulted showing too numerous to count red blood cells, CT scan shows distal right ureteral stone. A urine culture has been added on to the patient's urinalysis. We'll discharge with Percocet, Flomax, Zofran and urology follow-up. Patient agreeable to plan.   Harvest Dark, MD 02/20/16 [phone]

## 2016-02-20 NOTE — ED Notes (Signed)
Pt informed that urine is needed for UA, pt states unable now but will try again.

## 2016-02-20 NOTE — ED Triage Notes (Signed)
Pt co left flank pain that started 45 min ago, denies any hx of the same. Denies any dysuria or fever.

## 2016-02-20 NOTE — Discharge Instructions (Signed)
Please take your medications as prescribed, as needed. If your kidney stone does not pass in the next 7 days please call the number provided for urology to arrange an appointment. Please drink plenty of fluids. Return to the emergency department for any worsening pain, painful urination, or fever.

## 2016-02-21 LAB — URINE CULTURE: Culture: NO GROWTH

## 2016-02-22 ENCOUNTER — Ambulatory Visit (INDEPENDENT_AMBULATORY_CARE_PROVIDER_SITE_OTHER): Payer: Self-pay | Admitting: Urology

## 2016-02-22 ENCOUNTER — Encounter: Payer: Self-pay | Admitting: Urology

## 2016-02-22 VITALS — BP 110/70 | HR 80 | Ht 64.0 in | Wt 141.0 lb

## 2016-02-22 DIAGNOSIS — N201 Calculus of ureter: Secondary | ICD-10-CM

## 2016-02-22 MED ORDER — KETOROLAC TROMETHAMINE 60 MG/2ML IM SOLN
60.0000 mg | Freq: Once | INTRAMUSCULAR | Status: AC
  Administered 2016-02-22: 60 mg via INTRAMUSCULAR

## 2016-02-22 MED ORDER — PROMETHAZINE HCL 12.5 MG PO TABS: 12.5000 mg | ORAL_TABLET | Freq: Four times a day (QID) | ORAL | 0 refills | Status: DC | PRN

## 2016-02-22 MED ORDER — OXYCODONE HCL 15 MG PO TABS: 15.0000 mg | ORAL_TABLET | ORAL | 0 refills | Status: DC | PRN

## 2016-02-22 NOTE — Addendum Note (Signed)
Addended by: Wilson Singer on: 02/22/2016 04:37 PM   Modules accepted: Orders

## 2016-02-22 NOTE — Progress Notes (Signed)
02/22/2016 2:39 PM   Marinda Tomasita Crumble 1990/08/02 [id number]  Referring provider: No referring provider defined for this encounter.  Chief Complaint  Patient presents with  . Other    stone in right ureter    HPI: Ms Leedy is a 25 yo seen today for a right ureteral calculus.  3 days ago she presented to the ER with 1 day hx of severe sharp, intermittent, nonradiating right flank pain. She has associated nausea and vomiting. She also has increased urinary frequency.  This is her second stone event. Last stone event was 9 months ago. No fevers. No hematuria. No dysuria. Percocet 89m helps the pain for an hour then the pain returns.  She was given rx for flomax, zofran and percocet   PMH: Past Medical History:  Diagnosis Date  . Kidney stone     Surgical History: Past Surgical History:  Procedure Laterality Date  . none      Home Medications:    Medication List       Accurate as of 02/22/16  2:39 PM. Always use your most recent med list.          Norgestimate-Ethinyl Estradiol Triphasic 0.18/0.215/0.25 MG-35 MCG tablet Take 1 tablet by mouth daily. Reported on 05/07/2015   ondansetron 4 MG disintegrating tablet Commonly known as:  ZOFRAN-ODT Take 4 mg by mouth every 8 (eight) hours as needed for nausea or vomiting.   tamsulosin 0.4 MG Caps capsule Commonly known as:  FLOMAX Take 1 capsule (0.4 mg total) by mouth daily.       Allergies: No Known Allergies  Family History: Family History  Problem Relation Age of Onset  . Kidney disease Neg Hx   . Bladder Cancer Neg Hx     Social History:  reports that she has quit smoking. Her smoking use included Cigarettes. She smoked 0.50 packs per day. She has never used smokeless tobacco. She reports that she drinks alcohol. She reports that she does not use drugs.  ROS: UROLOGY Frequent Urination?: No Hard to postpone urination?: No Burning/pain with urination?: No Get up at night to urinate?: No Leakage of  urine?: No Urine stream starts and stops?: No Trouble starting stream?: No Do you have to strain to urinate?: No Blood in urine?: Yes Urinary tract infection?: No Sexually transmitted disease?: No Injury to kidneys or bladder?: No Painful intercourse?: No Weak stream?: No Currently pregnant?: No Vaginal bleeding?: No  Gastrointestinal Nausea?: Yes Vomiting?: Yes Indigestion/heartburn?: No Diarrhea?: Yes Constipation?: No  Constitutional Fever: No Night sweats?: No Weight loss?: No Fatigue?: Yes  Skin Skin rash/lesions?: No Itching?: No  Eyes Blurred vision?: No Double vision?: No  Ears/Nose/Throat Sore throat?: No Sinus problems?: No  Hematologic/Lymphatic Swollen glands?: No Easy bruising?: Yes  Cardiovascular Leg swelling?: No Chest pain?: Yes  Respiratory Cough?: No Shortness of breath?: No  Endocrine Excessive thirst?: No  Musculoskeletal Back pain?: Yes Joint pain?: No  Neurological Headaches?: Yes Dizziness?: Yes  Psychologic Depression?: No Anxiety?: No  Physical Exam: BP 110/70   Pulse 80   Ht _0  (1.626 m)   Wt 64 kg (141 lb)   LMP 01/23/2016   BMI 24.20 kg/m   Constitutional:  Alert and oriented, No acute distress. HEENT: [place] AT, moist mucus membranes.  Trachea midline, no masses. Cardiovascular: No clubbing, cyanosis, or edema. Respiratory: Normal respiratory effort, no increased work of breathing. GI: Abdomen is soft, nontender, nondistended, no abdominal masses GU: No CVA tenderness. Skin: No rashes, bruises or suspicious lesions.  Lymph: No cervical or inguinal adenopathy. Neurologic: Grossly intact, no focal deficits, moving all 4 extremities. Psychiatric: Normal mood and affect.  Laboratory Data: Lab Results  Component Value Date   WBC 13.6 (H) 02/20/2016   HGB 12.4 02/20/2016   HCT 36.9 02/20/2016   MCV 59.5 (L) 02/20/2016   PLT 279 02/20/2016    Lab Results  Component Value Date   CREATININE 0.76  02/20/2016    No results found for: PSA  No results found for: TESTOSTERONE  No results found for: HGBA1C  Urinalysis    Component Value Date/Time   COLORURINE YELLOW (A) 02/20/2016 0637   APPEARANCEUR CLEAR (A) 02/20/2016 0637   APPEARANCEUR Cloudy (A) 05/07/2015 1456   LABSPEC 1.011 02/20/2016 0637   LABSPEC 1.025 01/20/2013 1611   PHURINE 6.0 02/20/2016 0637   GLUCOSEU NEGATIVE 02/20/2016 0637   GLUCOSEU Negative 01/20/2013 1611   HGBUR 3+ (A) 02/20/2016 0637   BILIRUBINUR NEGATIVE 02/20/2016 0637   BILIRUBINUR Negative 05/07/2015 1456   BILIRUBINUR Negative 01/20/2013 1611   KETONESUR NEGATIVE 02/20/2016 0637   PROTEINUR NEGATIVE 02/20/2016 0637   NITRITE NEGATIVE 02/20/2016 0637   LEUKOCYTESUR NEGATIVE 02/20/2016 0637   LEUKOCYTESUR 1+ (A) 05/07/2015 1456   LEUKOCYTESUR 1+ 01/20/2013 1611    Pertinent Imaging: Ct stone protocol  Assessment & Plan:   1. Right ureteral calculus -We discussed MET versus URS and the pt elecats for MET -rx for phenergan, percocet, given -Toradol 24m IM -RTC 1-2 weeks  Strainer given   There are no diagnoses linked to this encounter.  No Follow-up on file.  PNicolette Bang MD  [facility] [address] [place] [phone]

## 2016-03-03 ENCOUNTER — Ambulatory Visit: Payer: Self-pay | Admitting: Urology

## 2016-03-03 ENCOUNTER — Encounter: Payer: Self-pay | Admitting: Urology

## 2016-05-05 ENCOUNTER — Ambulatory Visit: Payer: Self-pay | Admitting: Family Medicine

## 2016-05-16 ENCOUNTER — Ambulatory Visit: Payer: Self-pay | Admitting: Family Medicine

## 2016-07-14 ENCOUNTER — Emergency Department
Admission: EM | Admit: 2016-07-14 | Discharge: 2016-07-14 | Disposition: A | Discharge status: Home / Self Care | Payer: Self-pay

## 2016-09-19 DIAGNOSIS — D582 Other hemoglobinopathies: Secondary | ICD-10-CM | POA: Insufficient documentation

## 2017-04-16 ENCOUNTER — Emergency Department: Payer: Managed Care, Other (non HMO)

## 2017-04-16 ENCOUNTER — Encounter: Payer: Self-pay | Admitting: Emergency Medicine

## 2017-04-16 ENCOUNTER — Emergency Department
Admission: EM | Admit: 2017-04-16 | Discharge: 2017-04-16 | Disposition: A | Discharge status: Home / Self Care | Payer: Managed Care, Other (non HMO) | Attending: Emergency Medicine | Admitting: Emergency Medicine

## 2017-04-16 DIAGNOSIS — Z79899 Other long term (current) drug therapy: Secondary | ICD-10-CM | POA: Insufficient documentation

## 2017-04-16 DIAGNOSIS — N83209 Unspecified ovarian cyst, unspecified side: Secondary | ICD-10-CM | POA: Diagnosis not present

## 2017-04-16 DIAGNOSIS — R109 Unspecified abdominal pain: Secondary | ICD-10-CM | POA: Diagnosis present

## 2017-04-16 DIAGNOSIS — Z87891 Personal history of nicotine dependence: Secondary | ICD-10-CM | POA: Diagnosis not present

## 2017-04-16 LAB — URINALYSIS, COMPLETE (UACMP) WITH MICROSCOPIC
Bilirubin Urine: NEGATIVE
Glucose, UA: NEGATIVE mg/dL
Hgb urine dipstick: NEGATIVE
KETONES UR: NEGATIVE mg/dL
Leukocytes, UA: NEGATIVE
Nitrite: NEGATIVE
PH: 6 (ref 5.0–8.0)
Protein, ur: NEGATIVE mg/dL
SPECIFIC GRAVITY, URINE: 1.002 — AB (ref 1.005–1.030)

## 2017-04-16 LAB — BASIC METABOLIC PANEL
ANION GAP: 8 (ref 5–15)
BUN: 10 mg/dL (ref 6–20)
CALCIUM: 9.3 mg/dL (ref 8.9–10.3)
CHLORIDE: 104 mmol/L (ref 101–111)
CO2: 26 mmol/L (ref 22–32)
Creatinine, Ser: 0.55 mg/dL (ref 0.44–1.00)
Glucose, Bld: 88 mg/dL (ref 65–99)
Potassium: 3.8 mmol/L (ref 3.5–5.1)
Sodium: 138 mmol/L (ref 135–145)

## 2017-04-16 LAB — HEPATIC FUNCTION PANEL
ALT: 20 U/L (ref 14–54)
AST: 20 U/L (ref 15–41)
Albumin: 4.8 g/dL (ref 3.5–5.0)
Alkaline Phosphatase: 67 U/L (ref 38–126)
BILIRUBIN TOTAL: 0.3 mg/dL (ref 0.3–1.2)
Total Protein: 7.8 g/dL (ref 6.5–8.1)

## 2017-04-16 LAB — CBC
HEMATOCRIT: 36.5 % (ref 35.0–47.0)
Hemoglobin: 11.8 g/dL — ABNORMAL LOW (ref 12.0–16.0)
MCH: 19.3 pg — ABNORMAL LOW (ref 26.0–34.0)
MCHC: 32.3 g/dL (ref 32.0–36.0)
MCV: 59.7 fL — AB (ref 80.0–100.0)
PLATELETS: 296 10*3/uL (ref 150–440)
RBC: 6.11 MIL/uL — ABNORMAL HIGH (ref 3.80–5.20)
RDW: 15.5 % — ABNORMAL HIGH (ref 11.5–14.5)
WBC: 13.2 10*3/uL — ABNORMAL HIGH (ref 3.6–11.0)

## 2017-04-16 LAB — LIPASE, BLOOD: Lipase: 35 U/L (ref 11–51)

## 2017-04-16 LAB — POCT PREGNANCY, URINE
PREG TEST UR: NEGATIVE
Preg Test, Ur: NEGATIVE

## 2017-04-16 MED ORDER — ONDANSETRON 4 MG PO TBDP
ORAL_TABLET | ORAL | Status: AC
  Filled 2017-04-16: qty 1

## 2017-04-16 MED ORDER — MORPHINE SULFATE (PF) 4 MG/ML IV SOLN
4.0000 mg | Freq: Once | INTRAVENOUS | Status: AC
  Administered 2017-04-16: 4 mg via INTRAMUSCULAR

## 2017-04-16 MED ORDER — IPRATROPIUM-ALBUTEROL 0.5-2.5 (3) MG/3ML IN SOLN
RESPIRATORY_TRACT | Status: AC
  Filled 2017-04-16: qty 6

## 2017-04-16 MED ORDER — HYDROCODONE-ACETAMINOPHEN 5-325 MG PO TABS: 1.0000 | ORAL_TABLET | ORAL | 0 refills | Status: DC | PRN

## 2017-04-16 MED ORDER — ALBUTEROL (5 MG/ML) CONTINUOUS INHALATION SOLN: 10.0000 mg/h | INHALATION_SOLUTION | Freq: Once | RESPIRATORY_TRACT | Status: DC

## 2017-04-16 MED ORDER — MORPHINE SULFATE (PF) 4 MG/ML IV SOLN
INTRAVENOUS | Status: AC
  Filled 2017-04-16: qty 1

## 2017-04-16 MED ORDER — PREDNISONE 20 MG PO TABS: 60.0000 mg | ORAL_TABLET | Freq: Once | ORAL | Status: DC

## 2017-04-16 MED ORDER — ONDANSETRON 4 MG PO TBDP
4.0000 mg | ORAL_TABLET | Freq: Once | ORAL | Status: AC
  Administered 2017-04-16: 4 mg via ORAL

## 2017-04-16 NOTE — ED Triage Notes (Signed)
Pt comes into the ED via POV c/o right flank pain that started last week.  Patient states she has a h/o kidney stones and this feels similar to her last experiences with them.  Patient denies any painful urination today but states she had it earlier in the week.  Patient ambulatory to triage at this time and in NAD. Denies any fevers.

## 2017-04-16 NOTE — ED Provider Notes (Signed)
[facility] Emergency Department Provider Note  Time seen: 8:25 PM  I have reviewed the triage vital signs and the nursing notes.   HISTORY  Chief Complaint Flank Pain    HPI Kimberly Carney is a 27 y.o. female with a past medical history of 1 kidney stone last year, presents to the emergency department for right flank pain.  According to the patient for the past 1 week she has been expensing intermittent pain to the right flank has become constant more severe over the past 24 hours, along with nausea and vomiting at work.  States it does feel somewhat similar to a kidney stone she experienced last year.  Denies any hematuria or dysuria.  Denies vaginal bleeding or discharge.  Patient states she still has her gallbladder and appendix.  Denies any known fever.  Largely negative review of systems otherwise.  Past Medical History:  Diagnosis Date  . Kidney stone     Patient Active Problem List   Diagnosis Date Noted  . Kidney stones 05/10/2015  . Liver lesion 05/10/2015  . 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 Renal lesion 05/10/2015  . Pyelonephritis 05/10/2015    Past Surgical History:  Procedure Laterality Date  . none      Prior to Admission medications   Medication Sig Start Date End Date Taking? Authorizing Provider  Norgestimate-Ethinyl Estradiol Triphasic 0.18/0.215/0.25 MG-35 MCG tablet Take 1 tablet by mouth daily. Reported on 05/07/2015    [provider]  ondansetron (ZOFRAN-ODT) 4 MG disintegrating tablet Take 4 mg by mouth every 8 (eight) hours as needed for nausea or vomiting.    [provider]  oxyCODONE (ROXICODONE) 15 MG immediate release tablet Take 1 tablet (15 mg total) by mouth every 4 (four) hours as needed for pain. 02/22/16   McKenzie, Candee Furbish, MD  promethazine (PHENERGAN) 12.5 MG tablet Take 1 tablet (12.5 mg total) by mouth every 6 (six) hours as needed for nausea or vomiting. 02/22/16   McKenzie, Candee Furbish, MD  tamsulosin  (FLOMAX) 0.4 MG CAPS capsule Take 1 capsule (0.4 mg total) by mouth daily. 02/20/16   Harvest Dark, MD    No Known Allergies  Family History  Problem Relation Age of Onset  . Kidney disease Neg Hx   . Bladder Cancer Neg Hx     Social History Social History   Tobacco Use  . Smoking status: Former Smoker    Packs/day: 0.50    Types: Cigarettes  . Smokeless tobacco: Never Used  Substance Use Topics  . Alcohol use: Yes  . Drug use: No    Review of Systems Constitutional: Negative for fever. Eyes: Negative for visual complaints. ENT: Negative for congestion Cardiovascular: Negative for chest pain. Respiratory: Negative for shortness of breath. Gastrointestinal: Right flank pain.  Vomiting earlier today.  Negative for diarrhea. Genitourinary: Negative for dysuria.  Negative for hematuria Musculoskeletal: Right back pain Skin: Negative for rash. Neurological: Negative for headache All other ROS negative  ____________________________________________   PHYSICAL EXAM:  Constitutional: Alert and oriented. Well appearing and in no distress. Eyes: Normal exam ENT   Head: Normocephalic and atraumatic   Mouth/Throat: Mucous membranes are moist. Cardiovascular: Normal rate, regular rhythm. No murmur Respiratory: Normal respiratory effort without tachypnea nor retractions. Breath sounds are clear  Gastrointestinal: Soft, moderate right mid to lower abdominal tenderness to palpation.  Mild right CVA tenderness.  Abdomen otherwise benign.  No rebound or guarding.  No distention. Musculoskeletal: Nontender with normal range of motion in all extremities.  Neurologic:  Normal speech and language. No gross focal neurologic deficits  Skin:  Skin is warm, dry and intact.  Psychiatric: Mood and affect are normal.   ____________________________________________   RADIOLOGY  CT scan shows no acute abnormality.  Patient does have a fairly large right ovarian  cyst.  ____________________________________________   INITIAL IMPRESSION / ASSESSMENT AND PLAN / ED COURSE  Pertinent labs & imaging results that were available during my care of the patient were reviewed by me and considered in my medical decision making (see chart for details).  Department for right flank pain intermittent over the past 1 week now constant times 24 hours.  Differential would include urinary tract infection, pyelonephritis, ureterolithiasis, appendicitis, cholecystitis.  Patient's labs have resulted showing a moderate leukocytosis of 13,000, labs are otherwise within normal limits.  I have added on a lipase and hepatic function panel to further evaluate.  Given the patient's moderate right abdominal tenderness and leukocytosis I discussed with patient pros and cons of CT imaging she is agreeable to proceed with a noncontrasted CT to further evaluate.  Do not see triage vitals, will obtain in the room.  CT shows no acute abnormality besides a fairly large right ovarian cyst.  As the patient's pain is right-sided will obtain an ultrasound to further evaluate.  Patient agreeable to this plan of care.  Overall the patient appears very well, states her pain has resolved after pain medication.  Denies any discomfort at this time.  Ultrasound shows likely hemorrhagic cyst otherwise normal.  Patient will follow up with her OB/GYN, has an appointment next month for Pap.  ____________________________________________   FINAL CLINICAL IMPRESSION(S) / ED DIAGNOSES  Right flank pain Right ovarian cyst hemorrhagic cyst   Harvest Dark, MD 04/16/17 [id number]

## 2018-04-25 ENCOUNTER — Other Ambulatory Visit: Payer: Self-pay

## 2018-04-25 ENCOUNTER — Encounter: Payer: Self-pay | Admitting: Emergency Medicine

## 2018-04-25 ENCOUNTER — Emergency Department
Admission: EM | Admit: 2018-04-25 | Discharge: 2018-04-25 | Disposition: A | Discharge status: Home / Self Care | Payer: Medicaid Other | Attending: Emergency Medicine | Admitting: Emergency Medicine

## 2018-04-25 ENCOUNTER — Emergency Department: Payer: Medicaid Other

## 2018-04-25 DIAGNOSIS — S300XXA Contusion of lower back and pelvis, initial encounter: Secondary | ICD-10-CM

## 2018-04-25 DIAGNOSIS — Y929 Unspecified place or not applicable: Secondary | ICD-10-CM | POA: Insufficient documentation

## 2018-04-25 DIAGNOSIS — W108XXA Fall (on) (from) other stairs and steps, initial encounter: Secondary | ICD-10-CM | POA: Insufficient documentation

## 2018-04-25 DIAGNOSIS — W19XXXA Unspecified fall, initial encounter: Secondary | ICD-10-CM

## 2018-04-25 DIAGNOSIS — Y939 Activity, unspecified: Secondary | ICD-10-CM | POA: Insufficient documentation

## 2018-04-25 DIAGNOSIS — Y999 Unspecified external cause status: Secondary | ICD-10-CM | POA: Insufficient documentation

## 2018-04-25 DIAGNOSIS — S92424A Nondisplaced fracture of distal phalanx of right great toe, initial encounter for closed fracture: Secondary | ICD-10-CM

## 2018-04-25 DIAGNOSIS — Z87891 Personal history of nicotine dependence: Secondary | ICD-10-CM | POA: Insufficient documentation

## 2018-04-25 LAB — POCT PREGNANCY, URINE: Preg Test, Ur: NEGATIVE

## 2018-04-25 MED ORDER — HYDROCODONE-ACETAMINOPHEN 5-325 MG PO TABS
1.0000 | ORAL_TABLET | Freq: Once | ORAL | Status: AC
  Administered 2018-04-25: 1 via ORAL
  Filled 2018-04-25: qty 1

## 2018-04-25 MED ORDER — HYDROCODONE-ACETAMINOPHEN 5-325 MG PO TABS: 1.0000 | ORAL_TABLET | Freq: Four times a day (QID) | ORAL | 0 refills | Status: DC | PRN

## 2018-04-25 NOTE — Discharge Instructions (Addendum)
Follow-up with your regular doctor or Dr. Vickki Muff for evaluation of the broken toe.  Return emergency department worsening.  Take ibuprofen for pain as needed.  Vicodin for pain not controlled by ibuprofen.  Elevate and ice.

## 2018-04-25 NOTE — ED Triage Notes (Signed)
States she fell down some stairs last pm  Having pain to right foot,mainly great toe and to back  Ambulates well to treatment room

## 2018-04-25 NOTE — ED Provider Notes (Signed)
[facility] Emergency Department Provider Note  ____________________________________________   First MD Initiated Contact with Patient 04/25/18 1023     (approximate)  I have reviewed the triage vital signs and the nursing notes.   HISTORY  Chief Complaint Fall    HPI Kimberly Carney is a 28 y.o. female presents to the emergency department after a fall last night.  She is complaining of low back pain and right foot pain.  She denies any head injury or LOC.  She states that hurts to walk and the pain is located more around the right great toe.    Past Medical History:  Diagnosis Date  . Kidney stone     Patient Active Problem List   Diagnosis Date Noted  . Kidney stones 05/10/2015  . Liver lesion 05/10/2015  . 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 Renal lesion 05/10/2015  . Pyelonephritis 05/10/2015    Past Surgical History:  Procedure Laterality Date  . none      Prior to Admission medications   Medication Sig Start Date End Date Taking? Authorizing Provider  HYDROcodone-acetaminophen (NORCO/VICODIN) 5-325 MG tablet Take 1 tablet by mouth every 6 (six) hours as needed for moderate pain. 04/25/18   Oza Oberle, Linden Dolin, PA-C  Norgestimate-Ethinyl Estradiol Triphasic 0.18/0.215/0.25 MG-35 MCG tablet Take 1 tablet by mouth daily. Reported on 05/07/2015    [provider]    Allergies Patient has no known allergies.  Family History  Problem Relation Age of Onset  . Kidney disease Neg Hx   . Bladder Cancer Neg Hx     Social History Social History   Tobacco Use  . Smoking status: Former Smoker    Packs/day: 0.50    Types: Cigarettes  . Smokeless tobacco: Never Used  Substance Use Topics  . Alcohol use: Yes  . Drug use: No    Review of Systems  Constitutional: No fever/chills Eyes: No visual changes. ENT: No sore throat. Respiratory: Denies cough Genitourinary: Negative for dysuria. Musculoskeletal: Positive for back pain.  Positive for  right foot pain Skin: Negative for rash.    ____________________________________________   PHYSICAL EXAM:  VITAL SIGNS: ED Triage Vitals  Enc Vitals Group     BP 04/25/18 1023 112/70     Pulse Rate 04/25/18 1023 79     Resp 04/25/18 1023 14     Temp 04/25/18 1023 98 F (36.7 C)     Temp Source 04/25/18 1023 Oral     SpO2 04/25/18 1023 97 %     Weight 04/25/18 1024 140 lb (63.5 kg)     Height 04/25/18 1024 5\' 4"  (1.626 m)     Head Circumference --      Peak Flow --      Pain Score 04/25/18 1025 6     Pain Loc --      Pain Edu? --      Excl. in [place]? --     Constitutional: Alert and oriented. Well appearing and in no acute distress. Eyes: Conjunctivae are normal.  Head: Atraumatic. Nose: No congestion/rhinnorhea. Mouth/Throat: Mucous membranes are moist.   Neck:  supple no lymphadenopathy noted Cardiovascular: Normal rate, regular rhythm.  Respiratory: Normal respiratory effort.  No retractions,  GU: deferred Musculoskeletal: FROM all extremities, warm and well perfused, right foot is swollen and tender more at the right great toe.  Patient has difficulty bearing weight due to pain.  The lumbar spine and coccyx are mildly tender. Neurologic:  Normal speech and language.  Skin:  Skin is warm, dry and intact. No rash noted. Psychiatric: Mood and affect are normal. Speech and behavior are normal.  ____________________________________________   LABS (all labs ordered are listed, but only abnormal results are displayed)  Labs Reviewed  POC URINE PREG, ED  POCT PREGNANCY, URINE   ____________________________________________   ____________________________________________  RADIOLOGY  X-ray of the right foot and lumbar spine show negative fracture of the lumbar spine but a small avulsion fracture of the right great toe  ____________________________________________   PROCEDURES  Procedure(s) performed: Ace wrap, wooden  shoe  Procedures    ____________________________________________   INITIAL IMPRESSION / ASSESSMENT AND PLAN / ED COURSE  Pertinent labs & imaging results that were available during my care of the patient were reviewed by me and considered in my medical decision making (see chart for details).   Patient is 28 year old female presents emergency department complaining of back pain and right foot pain after fall last night.  Physical exam shows some lumbar tenderness and right great toe and foot tenderness.  X-ray lumbar spine is negative X-ray of the right foot shows a tiny avulsion fracture of the right great toe.  Explained the findings to the patient.  She was given a Vicodin while here in the ED.  Placed in Ace wrap and wooden shoe.  She is to follow-up with regular doctor if not better in 5 to 7 days.  She was instructed take Tylenol or ibuprofen.  Is given prescription for Vicodin.  She states she understands will comply.  She is to follow-up with Dr. Vickki Muff for podiatry.  She is discharged in stable condition.     As part of my medical decision making, I reviewed the following data within the [place] History obtained from family, Nursing notes reviewed and incorporated, Old chart reviewed, Radiograph reviewed x-ray of the lumbar spine is negative, x-ray of the right foot shows avulsion fracture of the great toe, Notes from prior ED visits and [place] Controlled Substance Database  ____________________________________________   FINAL CLINICAL IMPRESSION(S) / ED DIAGNOSES  Final diagnoses:  Nondisplaced fracture of distal phalanx of right great toe, initial encounter for closed fracture  Fall, initial encounter  Lumbar contusion, initial encounter      NEW MEDICATIONS STARTED DURING THIS VISIT:  Discharge Medication List as of 04/25/2018 12:04 PM       Note:  This document was prepared using Dragon voice recognition software and may include unintentional  dictation errors.    Versie Starks, PA-C 04/25/18 1532    Schuyler Amor, MD 04/25/18 [phone]

## 2018-04-29 ENCOUNTER — Emergency Department
Admission: EM | Admit: 2018-04-29 | Discharge: 2018-04-29 | Disposition: A | Discharge status: Home / Self Care | Payer: Self-pay | Attending: Emergency Medicine | Admitting: Emergency Medicine

## 2018-04-29 ENCOUNTER — Emergency Department: Payer: Self-pay

## 2018-04-29 ENCOUNTER — Other Ambulatory Visit: Payer: Self-pay

## 2018-04-29 DIAGNOSIS — F17210 Nicotine dependence, cigarettes, uncomplicated: Secondary | ICD-10-CM | POA: Insufficient documentation

## 2018-04-29 DIAGNOSIS — R109 Unspecified abdominal pain: Secondary | ICD-10-CM

## 2018-04-29 DIAGNOSIS — J09X2 Influenza due to identified novel influenza A virus with other respiratory manifestations: Secondary | ICD-10-CM | POA: Insufficient documentation

## 2018-04-29 DIAGNOSIS — R091 Pleurisy: Secondary | ICD-10-CM

## 2018-04-29 DIAGNOSIS — J101 Influenza due to other identified influenza virus with other respiratory manifestations: Secondary | ICD-10-CM

## 2018-04-29 DIAGNOSIS — R1011 Right upper quadrant pain: Secondary | ICD-10-CM | POA: Insufficient documentation

## 2018-04-29 LAB — COMPREHENSIVE METABOLIC PANEL
ALT: 39 U/L (ref 0–44)
ANION GAP: 5 (ref 5–15)
AST: 29 U/L (ref 15–41)
Albumin: 4.4 g/dL (ref 3.5–5.0)
Alkaline Phosphatase: 68 U/L (ref 38–126)
BUN: 14 mg/dL (ref 6–20)
CO2: 27 mmol/L (ref 22–32)
Calcium: 8.7 mg/dL — ABNORMAL LOW (ref 8.9–10.3)
Chloride: 106 mmol/L (ref 98–111)
Creatinine, Ser: 0.56 mg/dL (ref 0.44–1.00)
GFR calc Af Amer: >60 mL/min (ref >60)
GFR calc non Af Amer: >60 mL/min (ref >60)
Glucose, Bld: 88 mg/dL (ref 70–99)
Potassium: 3.5 mmol/L (ref 3.5–5.1)
Sodium: 138 mmol/L (ref 135–145)
Total Bilirubin: 0.3 mg/dL (ref 0.3–1.2)
Total Protein: 7.5 g/dL (ref 6.5–8.1)

## 2018-04-29 LAB — CBC WITH DIFFERENTIAL/PLATELET
Abs Immature Granulocytes: 0.02 10*3/uL (ref 0.00–0.07)
BASOS ABS: 0.1 10*3/uL (ref 0.0–0.1)
Basophils Relative: 1 %
Eosinophils Absolute: 0.2 10*3/uL (ref 0.0–0.5)
Eosinophils Relative: 3 %
HCT: 34.5 % — ABNORMAL LOW (ref 36.0–46.0)
Hemoglobin: 11.3 g/dL — ABNORMAL LOW (ref 12.0–15.0)
IMMATURE GRANULOCYTES: 0 %
Lymphocytes Relative: 40 %
Lymphs Abs: 3.1 10*3/uL (ref 0.7–4.0)
MCH: 19.6 pg — ABNORMAL LOW (ref 26.0–34.0)
MCHC: 32.8 g/dL (ref 30.0–36.0)
MCV: 59.7 fL — ABNORMAL LOW (ref 80.0–100.0)
Monocytes Absolute: 1.2 10*3/uL — ABNORMAL HIGH (ref 0.1–1.0)
Monocytes Relative: 15 %
NRBC: 0 % (ref 0.0–0.2)
Neutro Abs: 3.1 10*3/uL (ref 1.7–7.7)
Neutrophils Relative %: 41 %
Platelets: 268 10*3/uL (ref 150–400)
RBC: 5.78 MIL/uL — AB (ref 3.87–5.11)
RDW: 16.1 % — ABNORMAL HIGH (ref 11.5–15.5)
WBC: 7.7 10*3/uL (ref 4.0–10.5)

## 2018-04-29 LAB — TROPONIN I: Troponin I: <0.03 ng/mL (ref <0.03)

## 2018-04-29 LAB — LIPASE, BLOOD: Lipase: 27 U/L (ref 11–51)

## 2018-04-29 LAB — INFLUENZA PANEL BY PCR (TYPE A & B)
INFLAPCR: POSITIVE — AB
Influenza B By PCR: NEGATIVE

## 2018-04-29 MED ORDER — IBUPROFEN 400 MG PO TABS
600.0000 mg | ORAL_TABLET | ORAL | Status: AC
  Administered 2018-04-29: 600 mg via ORAL
  Filled 2018-04-29: qty 2

## 2018-04-29 NOTE — ED Provider Notes (Signed)
[facility] Emergency Department Provider Note   ____________________________________________   First MD Initiated Contact with Patient 04/29/18 2056     (approximate)  I have reviewed the triage vital signs and the nursing notes.   HISTORY  Chief Complaint Chest Pain and Cough    HPI Kimberly Carney is a 28 y.o. female ports previous history of prior kidney stone  Patient reports  that for about 5 days she has been experiencing runny nose cough slight feeling of chest tightness and congestion.  Occasional chills.  No nausea vomiting.  Some slight right-sided back and abdominal pain, but reports that is chronic is been present off and on for years.  Does not seem to have worsened.  Feels a slight burning feeling of heartburn which she gets from time to time on that area.  She reports when she takes a deep breath for the last couple of days she has had a moderate sharp feeling in the right chest.  No nausea vomiting.  No pain or burning with urination.  Denies pregnancy.  Does not take any medications to relieve her symptoms.  Past Medical History:  Diagnosis Date  . Kidney stone     Patient Active Problem List   Diagnosis Date Noted  . Kidney stones 05/10/2015  . Liver lesion 05/10/2015  . 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 Renal lesion 05/10/2015  . Pyelonephritis 05/10/2015    Past Surgical History:  Procedure Laterality Date  . none      Prior to Admission medications   Medication Sig Start Date End Date Taking? Authorizing Provider    04/25/18   Versie Starks, PA-C       [provider]  She reports she is no longer taking birth control or hydrocodone.  Allergies Patient has no known allergies.  Family History  Problem Relation Age of Onset  . Kidney disease Neg Hx   . Bladder Cancer Neg Hx     Social History Social History   Tobacco Use  . Smoking status: Current Every Day Smoker    Packs/day: 0.00    Types: Cigarettes    . Smokeless tobacco: Never Used  Substance Use Topics  . Alcohol use: Not Currently  . Drug use: No    Review of Systems Constitutional: No fever some chills and general fatigue. Eyes: No visual changes. ENT: Sore throat for a couple of days Cardiovascular: Denies chest pain. Respiratory: Denies shortness of breath.  See HPI Gastrointestinal: No abdominal pain except in her right upper flank which she reports is been present for years.   Genitourinary: Negative for dysuria. Musculoskeletal: Negative for back pain.  Some muscle aches over the last 4 to 5 days. Skin: Negative for rash. Neurological: Negative for headaches, areas of focal weakness or numbness.    ____________________________________________   PHYSICAL EXAM:  VITAL SIGNS: ED Triage Vitals  Enc Vitals Group     BP 04/29/18 1826 117/83     Pulse Rate 04/29/18 1826 94     Resp 04/29/18 1826 18     Temp 04/29/18 1826 98.2 F (36.8 C)     Temp Source 04/29/18 1826 Oral     SpO2 04/29/18 1826 99 %     Weight 04/29/18 1827 140 lb (63.5 kg)     Height 04/29/18 1827 5\' 4"  (1.626 m)     Head Circumference --      Peak Flow --      Pain Score 04/29/18 1826 6  Pain Loc --      Pain Edu? --      Excl. in [place]? --     Constitutional: Alert and oriented. Well appearing and in no acute distress.  Occasional dry cough. Eyes: Conjunctivae are normal. Head: Atraumatic. Nose: Slight clear coryza. Mouth/Throat: Mucous membranes are moist.  Posterior oropharynx slightly injected.  Tonsils normal without hypertrophy or exudates. Neck: No stridor.  Cardiovascular: Normal rate, regular rhythm. Grossly normal heart sounds.  Good peripheral circulation. Respiratory: Normal respiratory effort.  No retractions. Lungs CTAB.  Slight pleuritic pain when taking deep inspirations.  Speaks in full and clear sentences with normal oxygen level. Gastrointestinal: Soft and nontender there is mild discomfort reported in the right upper  quadrant, equivocal discomfort with Percell Miller.  No lower abdominal tenderness.  No pain to McBurney's point.  No rebound or guarding in any region.. No distention. Musculoskeletal: No lower extremity tenderness nor edema. Neurologic:  Normal speech and language. No gross focal neurologic deficits are appreciated.  Skin:  Skin is warm, dry and intact. No rash noted. Psychiatric: Mood and affect are normal. Speech and behavior are normal.  ____________________________________________   LABS (all labs ordered are listed, but only abnormal results are displayed)  Labs Reviewed  COMPREHENSIVE METABOLIC PANEL - Abnormal; Notable for the following components:      Result Value   Calcium 8.7 (*)    All other components within normal limits  CBC WITH DIFFERENTIAL/PLATELET - Abnormal; Notable for the following components:   RBC 5.78 (*)    Hemoglobin 11.3 (*)    HCT 34.5 (*)    MCV 59.7 (*)    MCH 19.6 (*)    RDW 16.1 (*)    Monocytes Absolute 1.2 (*)    All other components within normal limits  INFLUENZA PANEL BY PCR (TYPE A & B) - Abnormal; Notable for the following components:   Influenza A By PCR POSITIVE (*)    All other components within normal limits  TROPONIN I  LIPASE, BLOOD  URINALYSIS, COMPLETE (UACMP) WITH MICROSCOPIC  POC URINE PREG, ED   ____________________________________________  EKG  Reviewed and interpreted by me at 1830 Heart rate 99 QRS 80 440 Normal sinus rhythm, no evidence of acute ischemia. ____________________________________________  RADIOLOGY  Dg Chest 2 View  Result Date: 04/29/2018 CLINICAL DATA:  28 year old female with chest pain radiating to the back and right-sided. EXAM: CHEST - 2 VIEW COMPARISON:  Chest radiograph dated 03/11/2014 FINDINGS: The heart size and mediastinal contours are within normal limits. Both lungs are clear. The visualized skeletal structures are unremarkable. IMPRESSION: No active cardiopulmonary disease. Electronically Signed    By: Anner Crete M.D.   On: 04/29/2018 19:04   US Abdomen Limited Ruq  Result Date: 04/29/2018 CLINICAL DATA:  28 year old female with abdominal pain. EXAM: ULTRASOUND ABDOMEN LIMITED RIGHT UPPER QUADRANT COMPARISON:  CT of the abdomen pelvis dated 04/16/2017 and abdominal MRI dated 05/17/2015 FINDINGS: Gallbladder: The gallbladder is partially contracted otherwise unremarkable. No calcified gallstone, gallbladder wall thickening or pericholecystic fluid. Negative sonographic Murphy's sign. Common bile duct: Diameter: 2 mm Liver: A 1.8 x 1.7 x 2.0 cm echogenic lesion with no internal vascularity in the right lobe of the liver corresponding to the known segment 7 hemangioma. Portal vein is patent on color Doppler imaging with normal direction of blood flow towards the liver. IMPRESSION: 1. No gallstone or sonographic findings of acute cholecystitis. 2. Known hepatic hemangioma. Electronically Signed   By: Anner Crete M.D.   On:  04/29/2018 22:21    Chest x-ray reviewed negative for acute.  Right upper quadrant no evidence of acute cholecystitis. ____________________________________________   PROCEDURES  Procedure(s) performed: None  Procedures  Critical Care performed: No  ____________________________________________   INITIAL IMPRESSION / ASSESSMENT AND PLAN / ED COURSE  Pertinent labs & imaging results that were available during my care of the patient were reviewed by me and considered in my medical decision making (see chart for details).   Patient for evaluation for apparent upper respiratory possible viral symptoms.  No signs or symptoms of pneumonia.  Influenza test reveals influenza A, with her symptoms and seasonality I suspect she definitely has influenza but is had it for several days now.  She is doing well with stable hemodynamics without evidence of increased work of breathing.  Also suspect she is developed possible chest muscle strain from coughing or pleurisy.  Does  not appear to have any significant risk factors for pulmonary embolism, and she is PERC negative.  EKG and labs are reassuring.  Right upper quadrant ultrasound negative.  No urinary symptoms.  Discussed risks and benefits of Tamiflu and given the longevity of her symptoms at this point will opt not to treat her with Tamiflu as there is no notable anticipated improvement and she has no history of chronic pulmonary disease or ongoing chronic medical issue.  ----------------------------------------- 11:36 PM on 04/29/2018 -----------------------------------------  Discussed careful return precautions and conservative treatment recommendations and follow-up with the patient.  She is in agreement.  Appears well and reports ibuprofen is improved her discomfort and she is resting quite comfortably at this time. Return precautions and treatment recommendations and follow-up discussed with the patient who is agreeable with the plan.       ____________________________________________   FINAL CLINICAL IMPRESSION(S) / ED DIAGNOSES  Final diagnoses:  Abdominal pain, chronic Right sided  Influenza A  Pleurisy        Note:  This document was prepared using Dragon voice recognition software and may include unintentional dictation errors       Delman Kitten, MD 04/29/18 2336

## 2018-04-29 NOTE — ED Triage Notes (Signed)
Pt c/o chest pain that radiates into back and right side (described as heart burn) - last week she had chest tightness Pt has congested cough that started Monday - c/o cough ,runny nose, sore throat, headache

## 2018-09-17 LAB — HM HIV SCREENING LAB: HM HIV Screening: NEGATIVE

## 2018-09-17 LAB — HM PAP SMEAR: HM Pap smear: POSITIVE

## 2018-10-11 ENCOUNTER — Telehealth: Payer: Self-pay

## 2018-10-11 NOTE — Telephone Encounter (Signed)
LMTRC for prescreening.  

## 2018-10-11 NOTE — Telephone Encounter (Signed)
Pt prescreened no symptoms.   Coronavirus (COVID-19) Are you at risk?  Are you at risk for the Coronavirus (COVID-19)?  To be considered HIGH RISK for Coronavirus (COVID-19), you have to meet the following criteria:  . Traveled to [place], [place], [place], [place] or [place]; or in the [place] to [place], [place], [place], or [place]; and have fever, cough, and shortness of breath within the last 2 weeks of travel OR . Been in close contact with a person diagnosed with COVID-19 within the last 2 weeks and have fever, cough, and shortness of breath . IF YOU DO NOT MEET THESE CRITERIA, YOU ARE CONSIDERED LOW RISK FOR COVID-19.  What to do if you are HIGH RISK for COVID-19?  Marland Kitchen If you are having a medical emergency, call 911. . Seek medical care right away. Before you go to a doctor's office, urgent care or emergency department, call ahead and tell them about your recent travel, contact with someone diagnosed with COVID-19, and your symptoms. You should receive instructions from your physician's office regarding next steps of care.  . When you arrive at healthcare provider, tell the healthcare staff immediately you have returned from visiting [place], [place], [place], [place] or [place]; or traveled in the [place] to [place], [place], [place], or [place]; in the last two weeks or you have been in close contact with a person diagnosed with COVID-19 in the last 2 weeks.   . Tell the health care staff about your symptoms: fever, cough and shortness of breath. . After you have been seen by a medical provider, you will be either: o Tested for (COVID-19) and discharged home on quarantine except to seek medical care if symptoms worsen, and asked to  - Stay home and avoid contact with others until you get your results (4-5 days)  - Avoid travel on public transportation if possible (such as bus, train, or airplane) or o Sent to the Emergency Department by EMS for evaluation,  COVID-19 testing, and possible admission depending on your condition and test results.  What to do if you are LOW RISK for COVID-19?  Reduce your risk of any infection by using the same precautions used for avoiding the common cold or flu:  Marland Kitchen Wash your hands often with soap and warm water for at least 20 seconds.  If soap and water are not readily available, use an alcohol-based hand sanitizer with at least 60% alcohol.  . If coughing or sneezing, cover your mouth and nose by coughing or sneezing into the elbow areas of your shirt or coat, into a tissue or into your sleeve (not your hands). . Avoid shaking hands with others and consider head nods or verbal greetings only. . Avoid touching your eyes, nose, or mouth with unwashed hands.  . Avoid close contact with people who are sick. . Avoid places or events with large numbers of people in one location, like concerts or sporting events. . Carefully consider travel plans you have or are making. . If you are planning any travel outside or inside the [place], visit the CDC's Travelers' Health webpage for the latest health notices. . If you have some symptoms but not all symptoms, continue to monitor at home and seek medical attention if your symptoms worsen. . If you are having a medical emergency, call 911.   [place] / e-Visit: [email or web address]         [facility]  Urgent [facility]: [place] [facility]: [facility]: [phone]

## 2018-10-12 ENCOUNTER — Encounter: Payer: Self-pay | Admitting: Obstetrics and Gynecology

## 2018-10-12 ENCOUNTER — Ambulatory Visit: Payer: Medicaid Other | Attending: Oncology | Admitting: *Deleted

## 2018-10-12 ENCOUNTER — Other Ambulatory Visit: Payer: Self-pay

## 2018-10-12 ENCOUNTER — Encounter: Payer: Self-pay | Admitting: *Deleted

## 2018-10-12 ENCOUNTER — Ambulatory Visit (INDEPENDENT_AMBULATORY_CARE_PROVIDER_SITE_OTHER): Payer: Self-pay | Admitting: Obstetrics and Gynecology

## 2018-10-12 ENCOUNTER — Other Ambulatory Visit: Payer: Self-pay | Admitting: Obstetrics and Gynecology

## 2018-10-12 VITALS — BP 106/72 | HR 74 | Ht 63.0 in | Wt 148.0 lb

## 2018-10-12 VITALS — BP 107/77 | HR 90 | Temp 97.9°F | Ht 63.0 in | Wt 145.0 lb

## 2018-10-12 DIAGNOSIS — R87610 Atypical squamous cells of undetermined significance on cytologic smear of cervix (ASC-US): Secondary | ICD-10-CM

## 2018-10-12 DIAGNOSIS — Z3202 Encounter for pregnancy test, result negative: Secondary | ICD-10-CM

## 2018-10-12 DIAGNOSIS — R87810 Cervical high risk human papillomavirus (HPV) DNA test positive: Secondary | ICD-10-CM

## 2018-10-12 DIAGNOSIS — N926 Irregular menstruation, unspecified: Secondary | ICD-10-CM

## 2018-10-12 LAB — POCT URINE PREGNANCY: Preg Test, Ur: NEGATIVE

## 2018-10-12 NOTE — Progress Notes (Signed)
   [facility] [address] [place], [place]  [address] Phone:  [phone]   Fax:  [phone]   GYNECOLOGY CLINIC COLPOSCOPY PROCEDURE NOTE  28 y.o. G0P0000 here for colposcopy for ASCUS with POSITIVE high risk HPV pap smear on in 08/2018. Discussed role for HPV in cervical dysplasia, need for surveillance.  Patient given informed consent, signed copy in the chart, time out was performed.  Placed in lithotomy position. Cervix viewed with speculum and colposcope after application of acetic acid.   Colposcopy adequate? Yes  no visible lesions, no mosaicism, no punctation and no abnormal vasculature; corresponding biopsies obtained.  ECC specimen obtained. All specimens were labeled and sent to pathology.  Patient was given post procedure instructions.  Will follow up pathology and manage accordingly; patient will be contacted with results and recommendations.  Routine preventative health maintenance measures emphasized.   Rubie Maid, MD [facility]

## 2018-10-12 NOTE — Progress Notes (Signed)
28 year old Asian female referred to [place] by [facility] Department for financial assistance for further evaluation of recent abnormal pap of HPV positive ASCUS.  Reviewed results with patient and need for colposcopy and possible biopsy.  Patient states she has had a colposcopy in the past.  Hand-out given on HPV and Understanding Pap Smears.  Patient has been screened for eligibility.  She does not have any insurance, Medicare or Medicaid.  She also meets financial eligibility.  Hand-out given on the Affordable Care Act.  Patient is scheduled to see Dr. Marcelline Mates today at 10:00.  Will follow-up per [facility] protocol.

## 2018-10-12 NOTE — Progress Notes (Signed)
Pt is present today for colpo pt stated her LMP 09/03/18 and is currently sexually activity and do not use protection. Pt thinks she maybe pregnant. Pt done pregnancy test today results negative.

## 2018-10-12 NOTE — Patient Instructions (Signed)
Colposcopy, Care After This sheet gives you information about how to care for yourself after your procedure. Your doctor may also give you more specific instructions. If you have problems or questions, contact your doctor. What can I expect after the procedure? If you did not have a tissue sample removed (did not have a biopsy), you may only have some spotting for a few days. You can go back to your normal activities. If you had a tissue sample removed, it is common to have:  Soreness and pain. This may last for a few days.  Light-headedness.  Mild bleeding from your vagina or dark-colored, grainy discharge from your vagina. This may last for a few days. You may need to wear a sanitary pad.  Spotting for at least 48 hours after the procedure. Follow these instructions at home:   Take over-the-counter and prescription medicines only as told by your doctor. Ask your doctor what medicines you can start taking again. This is very important if you take blood-thinning medicine.  Do not drive or use heavy machinery while taking prescription pain medicine.  For 3 days, or as long as your doctor tells you, avoid: ? Douching. ? Using tampons. ? Having sex.  If you use birth control (contraception), keep using it.  Limit activity for the first day after the procedure. Ask your doctor what activities are safe for you.  It is up to you to get the results of your procedure. Ask your doctor when your results will be ready.  Keep all follow-up visits as told by your doctor. This is important. Contact a doctor if:  You get a skin rash. Get help right away if:  You are bleeding a lot from your vagina. It is a lot of bleeding if you are using more than one pad an hour for 2 hours in a row.  You have clumps of blood (blood clots) coming from your vagina.  You have a fever.  You have chills  You have pain in your lower belly (pelvic area).  You have signs of infection, such as vaginal  discharge that is: ? Different than usual. ? Yellow. ? Bad-smelling.  You have very pain or cramps in your lower belly that do not get better with medicine.  You feel light-headed.  You feel dizzy.  You pass out (faint). Summary  If you did not have a tissue sample removed (did not have a biopsy), you may only have some spotting for a few days. You can go back to your normal activities.  If you had a tissue sample removed, it is common to have mild pain and spotting for 48 hours.  For 3 days, or as long as your doctor tells you, avoid douching, using tampons and having sex.  Get help right away if you have bleeding, very bad pain, or signs of infection. This information is not intended to replace advice given to you by your health care provider. Make sure you discuss any questions you have with your health care provider. Document Released: 09/17/2007 Document Revised: 03/13/2017 Document Reviewed: 12/19/2015 Elsevier Patient Education  2020 [employer]  

## 2018-10-15 ENCOUNTER — Emergency Department
Admission: EM | Admit: 2018-10-15 | Discharge: 2018-10-15 | Disposition: A | Discharge status: Home / Self Care | Payer: Medicaid Other | Attending: Emergency Medicine | Admitting: Emergency Medicine

## 2018-10-15 ENCOUNTER — Other Ambulatory Visit: Payer: Self-pay

## 2018-10-15 ENCOUNTER — Encounter: Payer: Self-pay | Admitting: Emergency Medicine

## 2018-10-15 DIAGNOSIS — Z20828 Contact with and (suspected) exposure to other viral communicable diseases: Secondary | ICD-10-CM | POA: Insufficient documentation

## 2018-10-15 DIAGNOSIS — F17210 Nicotine dependence, cigarettes, uncomplicated: Secondary | ICD-10-CM | POA: Insufficient documentation

## 2018-10-15 DIAGNOSIS — B349 Viral infection, unspecified: Secondary | ICD-10-CM | POA: Insufficient documentation

## 2018-10-15 LAB — COMPREHENSIVE METABOLIC PANEL
ALT: 24 U/L (ref 0–44)
AST: 23 U/L (ref 15–41)
Albumin: 4.3 g/dL (ref 3.5–5.0)
Alkaline Phosphatase: 52 U/L (ref 38–126)
Anion gap: 9 (ref 5–15)
BUN: 11 mg/dL (ref 6–20)
CO2: 24 mmol/L (ref 22–32)
Calcium: 8.8 mg/dL — ABNORMAL LOW (ref 8.9–10.3)
Chloride: 104 mmol/L (ref 98–111)
Creatinine, Ser: 0.53 mg/dL (ref 0.44–1.00)
GFR calc Af Amer: >60 mL/min (ref >60)
GFR calc non Af Amer: >60 mL/min (ref >60)
Glucose, Bld: 93 mg/dL (ref 70–99)
Potassium: 3.8 mmol/L (ref 3.5–5.1)
Sodium: 137 mmol/L (ref 135–145)
Total Bilirubin: 0.5 mg/dL (ref 0.3–1.2)
Total Protein: 7.3 g/dL (ref 6.5–8.1)

## 2018-10-15 LAB — URINALYSIS, COMPLETE (UACMP) WITH MICROSCOPIC
Bilirubin Urine: NEGATIVE
Glucose, UA: NEGATIVE mg/dL
Hgb urine dipstick: NEGATIVE
Ketones, ur: NEGATIVE mg/dL
Leukocytes,Ua: NEGATIVE
Nitrite: NEGATIVE
Protein, ur: NEGATIVE mg/dL
Specific Gravity, Urine: 1.02 (ref 1.005–1.030)
pH: 6 (ref 5.0–8.0)

## 2018-10-15 LAB — CBC WITH DIFFERENTIAL/PLATELET
Abs Immature Granulocytes: 0.04 10*3/uL (ref 0.00–0.07)
Basophils Absolute: 0.1 10*3/uL (ref 0.0–0.1)
Basophils Relative: 1 %
Eosinophils Absolute: 0.1 10*3/uL (ref 0.0–0.5)
Eosinophils Relative: 1 %
HCT: 34.7 % — ABNORMAL LOW (ref 36.0–46.0)
Hemoglobin: 11.4 g/dL — ABNORMAL LOW (ref 12.0–15.0)
Immature Granulocytes: 0 %
Lymphocytes Relative: 32 %
Lymphs Abs: 4.6 10*3/uL — ABNORMAL HIGH (ref 0.7–4.0)
MCH: 19.6 pg — ABNORMAL LOW (ref 26.0–34.0)
MCHC: 32.9 g/dL (ref 30.0–36.0)
MCV: 59.6 fL — ABNORMAL LOW (ref 80.0–100.0)
Monocytes Absolute: 1.5 10*3/uL — ABNORMAL HIGH (ref 0.1–1.0)
Monocytes Relative: 10 %
Neutro Abs: 8 10*3/uL — ABNORMAL HIGH (ref 1.7–7.7)
Neutrophils Relative %: 56 %
Platelets: 328 10*3/uL (ref 150–400)
RBC: 5.82 MIL/uL — ABNORMAL HIGH (ref 3.87–5.11)
RDW: 16.1 % — ABNORMAL HIGH (ref 11.5–15.5)
WBC: 14.3 10*3/uL — ABNORMAL HIGH (ref 4.0–10.5)
nRBC: 0 % (ref 0.0–0.2)

## 2018-10-15 LAB — LIPASE, BLOOD: Lipase: 32 U/L (ref 11–51)

## 2018-10-15 LAB — PREGNANCY, URINE: Preg Test, Ur: NEGATIVE

## 2018-10-15 MED ORDER — ONDANSETRON HCL 4 MG/2ML IJ SOLN
4.0000 mg | Freq: Once | INTRAMUSCULAR | Status: AC
  Administered 2018-10-15: 4 mg via INTRAVENOUS
  Filled 2018-10-15: qty 2

## 2018-10-15 MED ORDER — SODIUM CHLORIDE 0.9 % IV SOLN
1000.0000 mL | Freq: Once | INTRAVENOUS | Status: AC
  Administered 2018-10-15: 20:00:00 1000 mL via INTRAVENOUS

## 2018-10-15 MED ORDER — SODIUM CHLORIDE 0.9 % IV SOLN: 1000.0000 mL | Freq: Once | INTRAVENOUS | Status: DC

## 2018-10-15 MED ORDER — KETOROLAC TROMETHAMINE 30 MG/ML IJ SOLN
30.0000 mg | Freq: Once | INTRAMUSCULAR | Status: AC
  Administered 2018-10-15: 30 mg via INTRAVENOUS
  Filled 2018-10-15: qty 1

## 2018-10-15 MED ORDER — ONDANSETRON 4 MG PO TBDP: 4.0000 mg | ORAL_TABLET | Freq: Three times a day (TID) | ORAL | 0 refills | Status: DC | PRN

## 2018-10-15 NOTE — ED Provider Notes (Signed)
[facility] Emergency Department Provider Note   ____________________________________________    I have reviewed the triage vital signs and the nursing notes.   HISTORY  Chief Complaint Headache and Nausea     HPI Kimberly Carney is a 28 y.o. female who presents with complaints of headache, fatigue, nausea and vomiting which started yesterday.  She did go hiking with friends the day before but reportedly no one with similar symptoms.  Lower abdominal cramping reported.  No dysuria.  Does not believe that she has had fevers.  Has not taken anything for this.  Review of records demonstrates a history of pyelonephritis,  Past Medical History:  Diagnosis Date  . Kidney stone     Patient Active Problem List   Diagnosis Date Noted  . Kidney stones 05/10/2015  . Liver lesion 05/10/2015  . 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 Renal lesion 05/10/2015  . Pyelonephritis 05/10/2015    Past Surgical History:  Procedure Laterality Date  . none      Prior to Admission medications   Not on File     Allergies Patient has no known allergies.  Family History  Problem Relation Age of Onset  . Healthy Mother   . Healthy Father   . Kidney disease Neg Hx   . Bladder Cancer Neg Hx     Social History Social History   Tobacco Use  . Smoking status: Current Every Day Smoker    Packs/day: 0.00    Types: Cigarettes  . Smokeless tobacco: Never Used  Substance Use Topics  . Alcohol use: Not Currently  . Drug use: No    Review of Systems  Constitutional: No fever/chills, positive fatigue Eyes: No visual changes.  ENT: No neck pain, no sore throat Cardiovascular: Denies chest pain. Respiratory: Denies shortness of breath. Gastrointestinal: As above Genitourinary: Negative for dysuria. Musculoskeletal: Negative for back pain. Skin: Negative for rash. Neurological: Positive for headache   ____________________________________________   PHYSICAL EXAM:   VITAL SIGNS: ED Triage Vitals  Enc Vitals Group     BP 10/15/18 1851 119/74     Pulse Rate 10/15/18 1851 88     Resp 10/15/18 1851 16     Temp 10/15/18 1851 99.4 F (37.4 C)     Temp Source 10/15/18 1851 Oral     SpO2 10/15/18 1851 98 %     Weight 10/15/18 1848 67.1 kg (148 lb)     Height 10/15/18 1848 1.6 m (5\' 3" )     Head Circumference --      Peak Flow --      Pain Score 10/15/18 1848 7     Pain Loc --      Pain Edu? --      Excl. in [place]? --     Constitutional: Alert and oriented.  Eyes: Conjunctivae are normal.   Nose: No congestion/rhinnorhea.  Pharynx normal Mouth/Throat: Mucous membranes are moist.   Neck:  Painless ROM Cardiovascular: Normal rate, regular rhythm. Grossly normal heart sounds.  Good peripheral circulation. Respiratory: Normal respiratory effort.  No retractions. Lungs CTAB. Gastrointestinal: Soft and nontender. No distention.  No CVA tenderness.  Musculoskeletal: Warm and well perfused Neurologic:  Normal speech and language. No gross focal neurologic deficits are appreciated.  Skin:  Skin is warm, dry and intact. No rash noted. Psychiatric: Mood and affect are normal. Speech and behavior are normal.  ____________________________________________   LABS (all labs ordered are listed, but only abnormal results are displayed)  Labs Reviewed  CBC WITH DIFFERENTIAL/PLATELET - Abnormal; Notable for the following components:      Result Value   WBC 14.3 (*)    RBC 5.82 (*)    Hemoglobin 11.4 (*)    HCT 34.7 (*)    MCV 59.6 (*)    MCH 19.6 (*)    RDW 16.1 (*)    Neutro Abs 8.0 (*)    Lymphs Abs 4.6 (*)    Monocytes Absolute 1.5 (*)    All other components within normal limits  COMPREHENSIVE METABOLIC PANEL - Abnormal; Notable for the following components:   Calcium 8.8 (*)    All other components within normal limits  URINALYSIS, COMPLETE (UACMP) WITH MICROSCOPIC - Abnormal; Notable for the following components:   Color, Urine YELLOW (*)     APPearance HAZY (*)    Bacteria, UA RARE (*)    All other components within normal limits  NOVEL CORONAVIRUS, NAA (HOSPITAL ORDER, SEND-OUT TO REF LAB)  LIPASE, BLOOD  PREGNANCY, URINE   ____________________________________________  EKG  None ____________________________________________  RADIOLOGY  None ____________________________________________   PROCEDURES  Procedure(s) performed: No  Procedures   Critical Care performed: No ____________________________________________   INITIAL IMPRESSION / ASSESSMENT AND PLAN / ED COURSE  Pertinent labs & imaging results that were available during my care of the patient were reviewed by me and considered in my medical decision making (see chart for details).  Patient presents with fever, headache, weakness nausea and vomiting as well as abdominal cramping suspicious for viral illness.  Differential includes gastroenteritis, COVID-19, UTI.  We will obtain labs, give IV fluids, IV Zofran and reevaluate ----------------------------------------- 9:59 PM on 10/15/2018 -----------------------------------------   Patient is feeling significantly better after IV fluids Zofran, IV Toradol, lab work notable primarily for elevated white blood cell count which is nonspecific and given her symptoms are most consistent with gastroenteritis which is rampant in the community this time I suspect that is the cause of her elevated white blood cell count.  She is well-appearing on reexam, will discharge with p.o. Zofran, supportive care, strict return precautions  Kimberly Carney was evaluated in Emergency Department on 10/15/2018 for the symptoms described in the history of present illness. She was evaluated in the context of the global COVID-19 pandemic, which necessitated consideration that the patient might be at risk for infection with the SARS-CoV-2 virus that causes COVID-19. Institutional protocols and algorithms that pertain to the evaluation of  patients at risk for COVID-19 are in a state of rapid change based on information released by regulatory bodies including the CDC and federal and state organizations. These policies and algorithms were followed during the patient's care in the ED.     ____________________________________________   FINAL CLINICAL IMPRESSION(S) / ED DIAGNOSES  Final diagnoses:  Viral illness        Note:  This document was prepared using Dragon voice recognition software and may include unintentional dictation errors.   Lavonia Drafts, MD 10/15/18 2200

## 2018-10-15 NOTE — ED Triage Notes (Signed)
C/O headache, nausea, emesis, feeling weak x 1 day.  Denies fever.  C/O headache to temples bilaterally and forehead.  AAOx3.  Skin warm and dry.  MAE equally and strong.  Posture upright and relaxed.  Gait steady.

## 2018-10-19 LAB — NOVEL CORONAVIRUS, NAA (HOSP ORDER, SEND-OUT TO REF LAB; TAT 18-24 HRS): SARS-CoV-2, NAA: NOT DETECTED

## 2018-10-19 LAB — ANATOMIC PATHOLOGY REPORT: PDF Image: 0

## 2018-10-27 ENCOUNTER — Encounter: Payer: Self-pay | Admitting: *Deleted

## 2018-10-27 NOTE — Progress Notes (Signed)
Letter mailed to inform patient of the appointment for her next mammogram through our [place] program on 09/21/19 @ 11:00.  HSIS to [facility].

## 2018-11-16 ENCOUNTER — Ambulatory Visit: Payer: Self-pay

## 2018-11-20 ENCOUNTER — Encounter: Payer: Self-pay | Admitting: Physician Assistant

## 2018-11-22 ENCOUNTER — Ambulatory Visit: Payer: Self-pay | Admitting: Physician Assistant

## 2018-11-22 ENCOUNTER — Encounter: Payer: Self-pay | Admitting: Physician Assistant

## 2018-11-22 ENCOUNTER — Other Ambulatory Visit: Payer: Self-pay

## 2018-11-22 DIAGNOSIS — N739 Female pelvic inflammatory disease, unspecified: Secondary | ICD-10-CM

## 2018-11-22 DIAGNOSIS — Z113 Encounter for screening for infections with a predominantly sexual mode of transmission: Secondary | ICD-10-CM

## 2018-11-22 LAB — WET PREP FOR TRICH, YEAST, CLUE
Trichomonas Exam: NEGATIVE
Yeast Exam: NEGATIVE

## 2018-11-22 MED ORDER — CEFTRIAXONE SODIUM 250 MG IJ SOLR
250.0000 mg | Freq: Once | INTRAMUSCULAR | Status: AC
  Administered 2018-11-22: 12:00:00 250 mg via INTRAMUSCULAR

## 2018-11-22 MED ORDER — AZITHROMYCIN 500 MG PO TABS
500.0000 mg | ORAL_TABLET | Freq: Once | ORAL | Status: AC
  Administered 2018-11-22: 12:00:00 500 mg via ORAL

## 2018-11-22 NOTE — Progress Notes (Addendum)
STI clinic/screening visit  Subjective:  Kimberly Carney is a 28 y.o. female being seen today for an STI screening visit. The patient reports they do have symptoms.  Patient has the following medical conditions:   Patient Active Problem List   Diagnosis Date Noted  . Kidney stones 05/10/2015  . Liver lesion 05/10/2015  . 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 Renal lesion 05/10/2015  . Pyelonephritis 05/10/2015     Chief Complaint  Patient presents with  . SEXUALLY TRANSMITTED DISEASE    HPI  Patient reports that she has had itching and irritation for about 1.5 weeks.  Also, mentions "white particles" with urination.  Denies other symptoms and concerns today.   See flowsheet for further details and programmatic requirements.    The following portions of the patient's history were reviewed and updated as appropriate: allergies, current medications, past medical history, past social history, past surgical history and problem list.  Objective:  There were no vitals filed for this visit.  Physical Exam Constitutional:      General: She is not in acute distress.    Appearance: Normal appearance.  HENT:     Head: Normocephalic and atraumatic.     Mouth/Throat:     Mouth: Mucous membranes are moist.     Pharynx: Oropharynx is clear. No oropharyngeal exudate or posterior oropharyngeal erythema.  Neck:     Musculoskeletal: Neck supple.  Pulmonary:     Effort: Pulmonary effort is normal.  Abdominal:     Palpations: Abdomen is soft. There is no mass.     Tenderness: There is abdominal tenderness. There is no guarding or rebound.     Comments: Diffuse tenderness lower quadrants bilaterally.  Genitourinary:    General: Normal vulva.     Rectum: Normal.     Comments: External genitalia/pubic area without nits, lice, edema, erythema, lesions and inguinal adenopathy. Vagina with normal mucosa and discharge. Cervix without visible lesions Uterus normal size, firm, mobile, tenderness with  palpation and movement, + CMT, no masses, no adnexal tenderness or fullness. Lymphadenopathy:     Cervical: No cervical adenopathy.  Skin:    General: Skin is warm and dry.     Findings: No erythema, lesion or rash.     Comments: Multiple tattoos  Neurological:     Mental Status: She is alert and oriented to person, place, and time.  Psychiatric:        Mood and Affect: Mood normal.        Behavior: Behavior normal.        Thought Content: Thought content normal.        Judgment: Judgment normal.       Assessment and Plan:  Kimberly Carney is a 28 y.o. female presenting to the [facility] Department for STI screening  1. Screening for STD (sexually transmitted disease) Patient into clinic with complaint of vaginal irritation and itching. Rec condoms with all sex Await test results.  Counseled that RN will call if needs to RTC for further treatment once results are back.  - WET PREP FOR [place], YEAST, CLUE - Chlamydia/Gonorrhea [facility] - HIV [place] [facility] - Syphilis Serology, [facility]  2. Pelvic inflammatory disease Will treat for PID with Azithromyin 1g po DOT today and to RTC in 1 week for PID pelvic recheck and second dose of Azithromycin 1g po on that date Ceftriaxone 250mg  IM also given today. No sex for 14 days and until after partner has completed treatment RTC  1 week for recheck and call in interim with questions or concerns.  - azithromycin (ZITHROMAX) tablet 500 mg - cefTRIAXone (ROCEPHIN) injection 250 mg  3.  Preventive Health Counseled patient to take MVI 1 po QD for folic acid and pregnancy Patient declined hormonal BCM and would be OK if she were to get pregnant.   No follow-ups on file.  Future Appointments  Date Time Provider [place]  09/21/2019 11:00 AM [facility] CLINIC [facility] None    Jerene Dilling, [place]

## 2018-11-29 ENCOUNTER — Other Ambulatory Visit: Payer: Self-pay

## 2018-11-29 ENCOUNTER — Ambulatory Visit: Payer: Self-pay | Admitting: Physician Assistant

## 2018-11-29 ENCOUNTER — Ambulatory Visit: Payer: Medicaid Other

## 2018-11-29 ENCOUNTER — Encounter: Payer: Self-pay | Admitting: Physician Assistant

## 2018-11-29 DIAGNOSIS — B3731 Acute candidiasis of vulva and vagina: Secondary | ICD-10-CM

## 2018-11-29 DIAGNOSIS — N739 Female pelvic inflammatory disease, unspecified: Secondary | ICD-10-CM

## 2018-11-29 DIAGNOSIS — Z09 Encounter for follow-up examination after completed treatment for conditions other than malignant neoplasm: Secondary | ICD-10-CM

## 2018-11-29 DIAGNOSIS — B373 Candidiasis of vulva and vagina: Secondary | ICD-10-CM

## 2018-11-29 MED ORDER — FLUCONAZOLE 150 MG PO TABS: 150.0000 mg | ORAL_TABLET | Freq: Once | ORAL | 0 refills | Status: AC

## 2018-11-29 MED ORDER — CLOTRIMAZOLE 1 % VA CREA: 1.0000 | TOPICAL_CREAM | Freq: Every day | VAGINAL | 0 refills | Status: DC

## 2018-11-29 MED ORDER — AZITHROMYCIN 500 MG PO TABS
1000.0000 mg | ORAL_TABLET | Freq: Once | ORAL | Status: AC
  Administered 2018-11-29: 17:00:00 1000 mg via ORAL

## 2018-11-29 MED ORDER — FLUCONAZOLE 150 MG PO TABS: 150.0000 mg | ORAL_TABLET | Freq: Once | ORAL | 0 refills | Status: DC

## 2018-11-29 NOTE — Progress Notes (Signed)
S:  Patient RTC for pelvic recheck and to complete treatment for PID.  States that she is feeling some better today.  Reports that she is having a lot of irritation in vaginal area with "sore" feeling and itching for the last several days.  Reports that she has not had sex and her partner has been treated as a contact to PID already. O:  WDWN female in NAD, A&O x 3;  External genitalia and perineum with moderate erythema, and grayish scaling, mild edema, no ulcerative lesions Bimanual= uterus normal size, firm, mobile, nt, no CMT, no masses, no adnexal tenderness or fullness; GC/Chlamydia and blood work still pending.  A/P:  1.  Patient improving with treatment and needs to complete treatment for PID today 2.  Azithromycin 1 g po DOT given today 3.  No sex for another 7 days 4.  Will give Fluconazole 150mg  1 po one time and to take in 2 days, if symptoms have not improved 5.  Clotrimazole 1% vaginal cream 1 app qhs for 7 days and may use some cream externally to help with symptoms. 6.  Call with questions or concerns 7.  RTC prn.

## 2018-11-29 NOTE — Addendum Note (Signed)
Addended by: Antoine Primas on: 11/29/2018 04:50 PM   Modules accepted: Orders

## 2019-01-26 NOTE — Telephone Encounter (Signed)
na

## 2019-02-15 ENCOUNTER — Emergency Department: Payer: No Typology Code available for payment source

## 2019-02-15 ENCOUNTER — Emergency Department
Admission: EM | Admit: 2019-02-15 | Discharge: 2019-02-16 | Disposition: A | Discharge status: Home / Self Care | Payer: No Typology Code available for payment source | Attending: Student | Admitting: Student

## 2019-02-15 ENCOUNTER — Other Ambulatory Visit: Payer: Self-pay

## 2019-02-15 ENCOUNTER — Encounter: Payer: Self-pay | Admitting: *Deleted

## 2019-02-15 DIAGNOSIS — Y999 Unspecified external cause status: Secondary | ICD-10-CM | POA: Diagnosis not present

## 2019-02-15 DIAGNOSIS — S39012A Strain of muscle, fascia and tendon of lower back, initial encounter: Secondary | ICD-10-CM | POA: Diagnosis not present

## 2019-02-15 DIAGNOSIS — F17210 Nicotine dependence, cigarettes, uncomplicated: Secondary | ICD-10-CM | POA: Diagnosis not present

## 2019-02-15 DIAGNOSIS — S199XXA Unspecified injury of neck, initial encounter: Secondary | ICD-10-CM | POA: Diagnosis present

## 2019-02-15 DIAGNOSIS — Y93I9 Activity, other involving external motion: Secondary | ICD-10-CM | POA: Insufficient documentation

## 2019-02-15 DIAGNOSIS — S161XXA Strain of muscle, fascia and tendon at neck level, initial encounter: Secondary | ICD-10-CM | POA: Diagnosis not present

## 2019-02-15 DIAGNOSIS — Y92410 Unspecified street and highway as the place of occurrence of the external cause: Secondary | ICD-10-CM | POA: Insufficient documentation

## 2019-02-15 DIAGNOSIS — Z79899 Other long term (current) drug therapy: Secondary | ICD-10-CM | POA: Insufficient documentation

## 2019-02-15 LAB — POCT PREGNANCY, URINE: Preg Test, Ur: NEGATIVE
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
## 2019-02-16 MED ORDER — METHOCARBAMOL 500 MG PO TABS: 500.0000 mg | ORAL_TABLET | Freq: Four times a day (QID) | ORAL | 0 refills | Status: DC

## 2019-02-16 MED ORDER — MELOXICAM 15 MG PO TABS: 15.0000 mg | ORAL_TABLET | Freq: Every day | ORAL | 0 refills | Status: DC

## 2019-02-16 MED ORDER — METHOCARBAMOL 500 MG PO TABS
500.0000 mg | ORAL_TABLET | Freq: Once | ORAL | Status: AC
  Administered 2019-02-16: 500 mg via ORAL
  Filled 2019-02-16: qty 1

## 2019-02-16 MED ORDER — HYDROCODONE-ACETAMINOPHEN 5-325 MG PO TABS
1.0000 | ORAL_TABLET | Freq: Once | ORAL | Status: AC
  Administered 2019-02-16: 1 via ORAL
  Filled 2019-02-16: qty 1

## 2019-02-17 ENCOUNTER — Other Ambulatory Visit: Payer: Self-pay | Admitting: *Deleted

## 2019-02-17 DIAGNOSIS — Z20822 Contact with and (suspected) exposure to covid-19: Secondary | ICD-10-CM

## 2019-02-19 LAB — NOVEL CORONAVIRUS, NAA: SARS-CoV-2, NAA: NOT DETECTED

## 2019-04-04 ENCOUNTER — Encounter: Payer: Self-pay | Admitting: Physician Assistant

## 2019-04-04 NOTE — Progress Notes (Signed)
Reviewed colpo report from Dr. Marcelline Mates and patient has appt with Dr. Marcelline Mates for follow up 09/2019.

## 2019-04-12 ENCOUNTER — Ambulatory Visit: Payer: Medicaid Other | Attending: Internal Medicine

## 2019-04-12 DIAGNOSIS — Z20822 Contact with and (suspected) exposure to covid-19: Secondary | ICD-10-CM

## 2019-04-13 LAB — NOVEL CORONAVIRUS, NAA: SARS-CoV-2, NAA: NOT DETECTED

## 2019-04-26 ENCOUNTER — Other Ambulatory Visit: Payer: Medicaid Other

## 2019-05-04 ENCOUNTER — Ambulatory Visit: Payer: Medicaid Other | Attending: Internal Medicine

## 2019-05-04 DIAGNOSIS — Z20822 Contact with and (suspected) exposure to covid-19: Secondary | ICD-10-CM

## 2019-05-05 LAB — NOVEL CORONAVIRUS, NAA: SARS-CoV-2, NAA: NOT DETECTED

## 2019-06-14 ENCOUNTER — Encounter: Payer: Self-pay | Admitting: Advanced Practice Midwife

## 2019-06-14 ENCOUNTER — Other Ambulatory Visit: Payer: Self-pay

## 2019-06-14 ENCOUNTER — Ambulatory Visit: Payer: Self-pay | Admitting: Advanced Practice Midwife

## 2019-06-14 DIAGNOSIS — F17200 Nicotine dependence, unspecified, uncomplicated: Secondary | ICD-10-CM

## 2019-06-14 DIAGNOSIS — Z113 Encounter for screening for infections with a predominantly sexual mode of transmission: Secondary | ICD-10-CM

## 2019-06-14 LAB — WET PREP FOR TRICH, YEAST, CLUE
Trichomonas Exam: NEGATIVE
Yeast Exam: NEGATIVE

## 2019-06-14 NOTE — Progress Notes (Signed)
Here today for STD screening. Accepts bloodwork. Jontavia Leatherbury, RN ° °

## 2019-06-14 NOTE — Progress Notes (Signed)
[employer] results reviewed. Per standing orders no treatment indicated. Hal Morales, RN

## 2019-06-14 NOTE — Progress Notes (Signed)
[facility] Department STI clinic/screening visit  Subjective:  Kimberly Carney is a 29 y.o. nullip smoker female being seen today for an STI screening visit. The patient reports they do have symptoms.  Patient reports that they do not desire a pregnancy in the next year.   They reported they are not interested in discussing contraception today.  Patient's last menstrual period was 05/17/2019 (exact date).   Patient has the following medical conditions:   Patient Active Problem List   Diagnosis Date Noted  . Kidney stones 05/10/2015  . Liver lesion 05/10/2015  . 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 Renal lesion 05/10/2015  . Pyelonephritis 05/10/2015    Chief Complaint  Patient presents with  . SEXUALLY TRANSMITTED DISEASE    HPI  Patient reports "irritation internally" and feels like "cuts" outside onset 06/07/19 Last pap 09/17/18 ASCUS +HPV--pt states she has an appt with [facility] already for repeat pap LMP 05/17/19.  Last ETOH yesterday (1 Margarita) q weekend.  Last sex yesterday without condom. 1 partner x 9 years  See flowsheet for further details and programmatic requirements.    The following portions of the patient's history were reviewed and updated as appropriate: allergies, current medications, past medical history, past social history, past surgical history and problem list.  Objective:  There were no vitals filed for this visit.  Physical Exam Vitals and nursing note reviewed.  Constitutional:      Appearance: Normal appearance.  HENT:     Head: Normocephalic and atraumatic.     Mouth/Throat:     Mouth: Mucous membranes are moist.     Pharynx: Oropharynx is clear. No oropharyngeal exudate or posterior oropharyngeal erythema.  Pulmonary:     Effort: Pulmonary effort is normal.  Abdominal:     General: Abdomen is flat.     Palpations: There is no mass.     Tenderness: There is no abdominal tenderness. There is no rebound.  Genitourinary:    General: Normal  vulva.     Exam position: Lithotomy position.     Pubic Area: No rash or pubic lice.      Labia:        Right: No rash or lesion.        Left: No rash or lesion.      Vagina: Normal. No vaginal discharge, erythema, bleeding or lesions.     Cervix: Normal.     Uterus: Normal.      Adnexa: Right adnexa normal and left adnexa normal.     Rectum: Normal.       Comments: 2 fissures noted on perineum onset 06/07/19 --HSV culture done Lymphadenopathy:     Head:     Right side of head: No preauricular or posterior auricular adenopathy.     Left side of head: No preauricular or posterior auricular adenopathy.     Cervical: No cervical adenopathy.     Upper Body:     Right upper body: No supraclavicular or axillary adenopathy.     Left upper body: No supraclavicular or axillary adenopathy.     Lower Body: No right inguinal adenopathy. No left inguinal adenopathy.  Skin:    General: Skin is warm and dry.     Findings: No rash.  Neurological:     Mental Status: She is alert and oriented to person, place, and time.      Assessment and Plan:  Kimberly Carney is a 29 y.o. female presenting to the [facility] Department for STI screening  1. Screening examination for venereal disease Treat wet mount per standing orders Immunization nurse consult - WET PREP FOR [place], YEAST, CLUE - Syphilis Serology, [place] [facility] - HIV [place] [facility] - Chlamydia/Gonorrhea [place] [facility] - HSV Culture, No Typing     Return for PRN.  Future Appointments  Date Time Provider [place]  09/21/2019 11:00 AM [facility] CLINIC [facility] None    Herbie Saxon, CNM

## 2019-07-18 ENCOUNTER — Ambulatory Visit: Payer: Medicaid Other

## 2019-07-19 ENCOUNTER — Ambulatory Visit: Payer: Medicaid Other

## 2019-07-26 ENCOUNTER — Ambulatory Visit: Payer: Medicaid Other

## 2019-09-14 ENCOUNTER — Other Ambulatory Visit: Payer: Self-pay

## 2019-09-14 ENCOUNTER — Ambulatory Visit: Payer: Self-pay | Admitting: Physician Assistant

## 2019-09-14 DIAGNOSIS — B009 Herpesviral infection, unspecified: Secondary | ICD-10-CM

## 2019-09-14 DIAGNOSIS — Z113 Encounter for screening for infections with a predominantly sexual mode of transmission: Secondary | ICD-10-CM

## 2019-09-15 ENCOUNTER — Encounter: Payer: Self-pay | Admitting: Physician Assistant

## 2019-09-15 DIAGNOSIS — B009 Herpesviral infection, unspecified: Secondary | ICD-10-CM | POA: Insufficient documentation

## 2019-09-15 NOTE — Progress Notes (Signed)
[facility] Department STI clinic/screening visit  Subjective:  Kimberly Carney is a 29 y.o. female being seen today for an STI screening visit. The patient reports they do have symptoms.  Patient reports that they do not desire a pregnancy in the next year.   They reported they are not interested in discussing contraception today.  No LMP recorded.   Patient has the following medical conditions:   Patient Active Problem List   Diagnosis Date Noted  . Smoker 3-7 cpd 06/14/2019  . Kidney stones 05/10/2015  . Liver lesion 05/10/2015  . 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 Renal lesion 05/10/2015  . Pyelonephritis 05/10/2015    Chief Complaint  Patient presents with  . 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  States that she was also tested for [facility] and Chlamydia but did not have blood work done.  States that when she talked with the provider from the ER that they recommended that she have her blood drawn for HIV.  Patient states that she has questions about HSV dx.  LMP 09/06/2019 and normal; is not sure when last pap was done and last HIV testing was done at the beginning of this year.    See flowsheet for further details and programmatic requirements.    The following portions of the patient's history were reviewed and updated as appropriate: allergies, current medications, past medical history, past social history, past surgical history and problem list.  Objective:  There were no vitals filed for this visit.  Physical Exam Constitutional:      General: She is not in acute distress.    Appearance: Normal appearance.  HENT:     Head: Normocephalic and atraumatic.  Eyes:     Conjunctiva/sclera: Conjunctivae normal.  Pulmonary:     Effort: Pulmonary effort is normal.  Genitourinary:    General: Normal vulva.     Rectum: Normal.     Comments: Pubic area without nits,  lice, edema, erythema or inguinal adenopathy.  External labia majora on R side with 6 pinkish, healed areas where patient reports that she was having sores, nt, no ulceration or exudate. Neurological:     Mental Status: She is alert and oriented to person, place, and time.  Psychiatric:        Mood and Affect: Mood normal.        Behavior: Behavior normal.        Thought Content: Thought content normal.        Judgment: Judgment normal.      Assessment and Plan:  Kimberly Carney is a 28 y.o. female presenting to the [facility] Department for STI screening  1. Screening for STD (sexually transmitted disease) Patient into clinic with resolving symptoms.  Requests HIV and RPR today since this was not done at her ER visit. Rec no sex until lesions completely healed and she feels that she is back to normal. Rec condoms with all sex. 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 Offered patient Rx for episodic treatment or suppressive treatment and patient opts to think about options and call back to let me know what she has decided. Patient provided with my voicemail number to leave me a message about whether she wants suppressive or episodic treatment and which pharmacy she  would like the Rx called into.  Counseled patient to call or RTC with other questions or concerns.  Counseled patient that to continue prescribing for this condition we have to see them at least 1 time a year for a face-to-face visit. - HIV [place] [facility] - Syphilis Serology, [place] [facility]     No follow-ups on file.  Future Appointments  Date Time Provider [place]  09/21/2019 11:00 AM [facility] CLINIC [facility] None    Jerene Dilling, [place]

## 2019-09-21 ENCOUNTER — Ambulatory Visit: Payer: No Typology Code available for payment source | Attending: Oncology

## 2019-09-27 ENCOUNTER — Telehealth: Payer: Self-pay | Admitting: Physician Assistant

## 2019-09-27 DIAGNOSIS — Z299 Encounter for prophylactic measures, unspecified: Secondary | ICD-10-CM

## 2019-09-27 MED ORDER — ACYCLOVIR 800 MG PO TABS: 800.0000 mg | ORAL_TABLET | Freq: Every day | ORAL | 0 refills | Status: DC

## 2019-09-27 NOTE — Telephone Encounter (Signed)
Patient left message on voicemail requesting RX for Acyclovir for 10 days be called into [place].  Call to patient to notify her that I will send the Rx to her pharmacy of choice.  Rx for Acyclovir 800mg  #10 1 po daily with no refills until patient calls back and lets me know her decision on whether she wants suppressive vs episodic tx for the future.

## 2019-10-07 ENCOUNTER — Telehealth: Payer: Self-pay | Admitting: Physician Assistant

## 2019-10-07 DIAGNOSIS — B009 Herpesviral infection, unspecified: Secondary | ICD-10-CM

## 2019-10-07 MED ORDER — ACYCLOVIR 800 MG PO TABS: 800.0000 mg | ORAL_TABLET | Freq: Every day | ORAL | 0 refills | Status: DC

## 2019-10-07 NOTE — Telephone Encounter (Signed)
Received message from patient requesting 30 day supply of Acyclovir 800 mg 1 po daily.  Call to patient to let her know that I will send in Rx as requested.

## 2019-11-03 ENCOUNTER — Telehealth: Payer: Self-pay

## 2019-11-03 NOTE — Telephone Encounter (Signed)
Telephone call to patient today regarding the need for a repeat PAP thru the [facility] program.  Patient would like me to call Christy at [facility] to schedule the appointment and call her back.   TC to [facility] for an appointment.  Availability was Wed. 11/09/2019 at 11am.  Return call to patient today and patient aware of her appointment on Wednesday 11/09/2019 at 11am (arrival time 10:45).  Dahlia Bailiff, RN

## 2019-11-09 ENCOUNTER — Ambulatory Visit: Payer: Self-pay | Attending: Oncology | Admitting: *Deleted

## 2019-11-09 ENCOUNTER — Encounter: Payer: Self-pay | Admitting: *Deleted

## 2019-11-09 ENCOUNTER — Other Ambulatory Visit: Payer: Self-pay

## 2019-11-09 VITALS — BP 114/95 | HR 97 | Temp 98.3°F | Ht 65.5 in | Wt 133.4 lb

## 2019-11-09 DIAGNOSIS — Z0000 Encounter for general adult medical examination without abnormal findings: Secondary | ICD-10-CM

## 2019-11-09 NOTE — Progress Notes (Signed)
°  Subjective:     Patient ID: Kimberly Carney, female   DOB: 02-02-1991, 29 y.o.   MRN: [id number]  HPI   Review of Systems     Objective:   Physical Exam Genitourinary:    Exam position: Lithotomy position.     Pubic Area: No rash or pubic lice.      Labia:        Right: No rash, tenderness, lesion or injury.        Left: No rash, tenderness, lesion or injury.      Urethra: No prolapse, urethral pain, urethral swelling or urethral lesion.     Vagina: No signs of injury and foreign body. Vaginal discharge present. No erythema, tenderness, bleeding, lesions or prolapsed vaginal walls.     Cervix: No cervical motion tenderness, discharge, friability, lesion, erythema, cervical bleeding or eversion.     Uterus: Not deviated, not enlarged, not fixed, not tender and no uterine prolapse.      Adnexa:        Right: No mass.         Left: No mass.             Assessment:     29 year old [place] female returns to [facility] for 1 year follow up pap smear.  Patient had HPV+ ASCUS pap on 09/17/18 with benign ECC.  Recommendation by Dr. Marcelline Mates for repeat pap in one year.  Specimen collected for pap smear without difficulty.  Patient has been screened for eligibility.  She does not have any insurance, Medicare or Medicaid.  She also meets financial eligibility.     Plan:     Specimen for pap sent to the lab.  Will follow up BCCCP protocol and ASCCP guidelines.

## 2019-11-14 LAB — IGP, APTIMA HPV: HPV Aptima: POSITIVE — AB

## 2019-11-15 ENCOUNTER — Other Ambulatory Visit: Payer: Self-pay | Admitting: Physician Assistant

## 2019-11-15 ENCOUNTER — Telehealth: Payer: Self-pay | Admitting: Physician Assistant

## 2019-11-15 DIAGNOSIS — B009 Herpesviral infection, unspecified: Secondary | ICD-10-CM

## 2019-11-15 MED ORDER — ACYCLOVIR 800 MG PO TABS: 800.0000 mg | ORAL_TABLET | Freq: Every day | ORAL | 0 refills | Status: DC

## 2019-11-15 NOTE — Progress Notes (Signed)
Received message from patient that she would like a month supply of Acyclovir called/sent to her pharmacy.

## 2019-11-15 NOTE — Telephone Encounter (Signed)
Call to patient to let her know that I received her message and have sent in Rx for Acyclovir 800 mg #30 1 po daily per her request.  Patient states that she will pick it up once they notify her that it is ready.

## 2019-11-16 ENCOUNTER — Encounter: Payer: Self-pay | Admitting: *Deleted

## 2019-11-16 NOTE — Progress Notes (Signed)
Letter mailed to inform patient of her results and need for follow up pap in one year on 11/07/20 @ 11:00.

## 2019-11-25 ENCOUNTER — Telehealth: Payer: Self-pay | Admitting: Family Medicine

## 2019-11-25 NOTE — Telephone Encounter (Signed)
TC to patient, returning her call. Patient counseled that we can't see lab test for BV in her chart. Patient states she is having symptoms, and was offered STD appointment. Patient states she does not need to discuss BCM, just wants STD appointment. Appointment scheduled for 11/28/2019.Marland KitchenJenetta Downer, RN

## 2019-11-25 NOTE — Telephone Encounter (Signed)
Patient went to the cancer center for the pap and they called her with results and told her she had BV and now needs tx, unable to get tx from them until next week was told to call us

## 2019-11-28 ENCOUNTER — Ambulatory Visit: Payer: Self-pay

## 2019-11-29 ENCOUNTER — Emergency Department
Admission: EM | Admit: 2019-11-29 | Discharge: 2019-11-30 | Disposition: A | Discharge status: Home / Self Care | Payer: Medicaid Other | Attending: Emergency Medicine | Admitting: Emergency Medicine

## 2019-11-29 ENCOUNTER — Encounter: Payer: Self-pay | Admitting: Emergency Medicine

## 2019-11-29 ENCOUNTER — Other Ambulatory Visit: Payer: Self-pay

## 2019-11-29 DIAGNOSIS — R21 Rash and other nonspecific skin eruption: Secondary | ICD-10-CM | POA: Insufficient documentation

## 2019-11-29 DIAGNOSIS — T7840XA Allergy, unspecified, initial encounter: Secondary | ICD-10-CM | POA: Insufficient documentation

## 2019-11-29 DIAGNOSIS — T783XXA Angioneurotic edema, initial encounter: Secondary | ICD-10-CM

## 2019-11-29 DIAGNOSIS — L509 Urticaria, unspecified: Secondary | ICD-10-CM | POA: Insufficient documentation

## 2019-11-29 DIAGNOSIS — R220 Localized swelling, mass and lump, head: Secondary | ICD-10-CM | POA: Insufficient documentation

## 2019-11-29 DIAGNOSIS — F17210 Nicotine dependence, cigarettes, uncomplicated: Secondary | ICD-10-CM | POA: Insufficient documentation

## 2019-11-29 NOTE — ED Triage Notes (Addendum)
Pt presents to ED with allergic reaction for the past 3 weeks. Pt states she has hives on her trunk and lower extremities with itching that appear at the same time every night; but tonight she noticed hives up her neck and swelling to the right side of her lower lip. Pt states it does feel "hard to swallow". Denies swelling to tongue. Unsure of cause. 25mg  Benadryl taken around 2300 with no relief. Denies sob.

## 2019-11-30 MED ORDER — SODIUM CHLORIDE 0.9 % IV BOLUS
1000.0000 mL | Freq: Once | INTRAVENOUS | Status: AC
  Administered 2019-11-30: 1000 mL via INTRAVENOUS

## 2019-11-30 MED ORDER — FAMOTIDINE IN NACL 20-0.9 MG/50ML-% IV SOLN
20.0000 mg | Freq: Once | INTRAVENOUS | Status: AC
  Administered 2019-11-30: 20 mg via INTRAVENOUS
  Filled 2019-11-30: qty 50

## 2019-11-30 MED ORDER — METHYLPREDNISOLONE SODIUM SUCC 125 MG IJ SOLR
125.0000 mg | Freq: Once | INTRAMUSCULAR | Status: AC
  Administered 2019-11-30: 125 mg via INTRAVENOUS
  Filled 2019-11-30: qty 2

## 2019-11-30 MED ORDER — DIPHENHYDRAMINE HCL 50 MG/ML IJ SOLN
25.0000 mg | Freq: Once | INTRAMUSCULAR | Status: AC
  Administered 2019-11-30: 25 mg via INTRAVENOUS
  Filled 2019-11-30: qty 1

## 2019-11-30 MED ORDER — PREDNISONE 20 MG PO TABS: ORAL_TABLET | ORAL | 0 refills | Status: DC

## 2019-11-30 MED ORDER — EPINEPHRINE 0.3 MG/0.3ML IJ SOAJ: 0.3000 mg | Freq: Once | INTRAMUSCULAR | 1 refills | Status: AC

## 2019-11-30 MED ORDER — EPINEPHRINE 0.3 MG/0.3ML IJ SOAJ
0.3000 mg | Freq: Once | INTRAMUSCULAR | Status: AC
  Administered 2019-11-30: 0.3 mg via INTRAMUSCULAR
  Filled 2019-11-30: qty 0.3

## 2019-11-30 MED ORDER — FAMOTIDINE 20 MG PO TABS: 20.0000 mg | ORAL_TABLET | Freq: Two times a day (BID) | ORAL | 0 refills | Status: AC

## 2019-11-30 NOTE — ED Provider Notes (Signed)
[facility] Emergency Department Provider Note   ____________________________________________   First MD Initiated Contact with Patient 11/30/19 0004     (approximate)  I have reviewed the triage vital signs and the nursing notes.   HISTORY  Chief Complaint Allergic Reaction    HPI Kimberly Carney is a 29 y.o. female who presents to the ED from home with a chief complaint of allergic reaction.  Patient reports for the past 3 weeks, at nighttime she has experienced hives.  She takes a Benadryl at bedtime and hives resolved until the next evening.  At first she thought it was secondary to a cat she was sitting but she has not seen the cat for the past 2 weeks.  Has not seen a doctor for this.  Presents tonight because she started having swelling to her lower lip and a sensation feeling hard to swallow.  Took 25 mg oral Benadryl around 11 PM.  Denies fever, cough, chest pain, shortness of breath, abdominal pain, nausea, vomiting or diarrhea.  Denies new exposures, medications including over-the-counter's, antibiotics, environmental exposure other than the cat 3 weeks ago.       Past Medical History:  Diagnosis Date  . Kidney stone     Patient Active Problem List   Diagnosis Date Noted  . 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 Smoker 3-7 cpd 06/14/2019  . Kidney stones 05/10/2015  . Liver lesion 05/10/2015  . 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 Renal lesion 05/10/2015  . Pyelonephritis 05/10/2015    Past Surgical History:  Procedure Laterality Date  . none      Prior to Admission medications   Medication Sig Start Date End Date Taking? Authorizing Provider  acyclovir (ZOVIRAX) 800 MG tablet Take 1 tablet (800 mg total) by mouth daily. 11/15/19  Yes Jerene Dilling, PA  acyclovir (ZOVIRAX) 800 MG tablet Take 1 tablet (800 mg total) by mouth daily. 09/27/19   Jerene Dilling, PA  acyclovir (ZOVIRAX) 800 MG tablet Take 1 tablet (800 mg total) by mouth daily. 10/07/19    Jerene Dilling, PA  EPINEPHrine 0.3 mg/0.3 mL IJ SOAJ injection Inject 0.3 mLs (0.3 mg total) into the muscle once for 1 dose. 11/30/19 11/30/19  Paulette Blanch, MD  famotidine (PEPCID) 20 MG tablet Take 1 tablet (20 mg total) by mouth 2 (two) times daily. 11/30/19   Paulette Blanch, MD  predniSONE (DELTASONE) 20 MG tablet 3 tablets daily x 4 days 11/30/19   Paulette Blanch, MD    Allergies Patient has no known allergies.  Family History  Problem Relation Age of Onset  . Healthy Mother   . Healthy Father   . Diabetes Paternal Grandmother   . Colon cancer Paternal Grandfather   . Kidney disease Neg Hx   . Bladder Cancer Neg Hx     Social History Social History   Tobacco Use  . Smoking status: Current Every Day Smoker    Packs/day: 0.00    Types: Cigarettes  . Smokeless tobacco: Never Used  Vaping Use  . Vaping Use: Never used  Substance Use Topics  . Alcohol use: Yes    Alcohol/week: 1.0 standard drink    Types: 1 Standard drinks or equivalent per week    Comment: q weekend  . Drug use: No    Review of Systems  Constitutional: No fever/chills Eyes: No visual changes. ENT: Positive for lower lip swelling.  No sore throat. Cardiovascular: Denies chest pain. Respiratory: Denies shortness of breath.  Gastrointestinal: No abdominal pain.  No nausea, no vomiting.  No diarrhea.  No constipation. Genitourinary: Negative for dysuria. Musculoskeletal: Negative for back pain. Skin: Positive for rash. Neurological: Negative for headaches, focal weakness or numbness.   ____________________________________________   PHYSICAL EXAM:  VITAL SIGNS: ED Triage Vitals  Enc Vitals Group     BP 11/29/19 2320 (!) 110/94     Pulse Rate 11/29/19 2320 78     Resp 11/29/19 2320 18     Temp 11/29/19 2320 (!) 96.5 F (35.8 C)     Temp Source 11/29/19 2320 Oral     SpO2 11/29/19 2320 100 %     Weight 11/29/19 2320 139 lb 15.9 oz (63.5 kg)     Height 11/29/19 2340 5\' 4"  (1.626 m)     Head  Circumference --      Peak Flow --      Pain Score 11/29/19 2340 6     Pain Loc --      Pain Edu? --      Excl. in [place]? --     Constitutional: Alert and oriented. Well appearing and in no acute distress. Eyes: Conjunctivae are normal. PERRL. EOMI. Head: Atraumatic. Nose: No congestion/rhinnorhea. Mouth/Throat: Mucous membranes are moist.  Right lower lip swollen.  No posterior oropharyngeal swelling.  There is no hoarse or muffled voice.  There is no drooling.  Patient tolerating secretions well.   Neck: No stridor.  No neck swelling. Cardiovascular: Normal rate, regular rhythm. Grossly normal heart sounds.  Good peripheral circulation. Respiratory: Normal respiratory effort.  No retractions. Lungs CTAB. Gastrointestinal: Soft and nontender. No distention. No abdominal bruits. No CVA tenderness. Musculoskeletal: No lower extremity tenderness nor edema.  No joint effusions. Neurologic:  Normal speech and language. No gross focal neurologic deficits are appreciated. No gait instability. Skin:  Skin is warm, dry and intact.  Resolving hives noted. Psychiatric: Mood and affect are normal. Speech and behavior are normal.  ____________________________________________   LABS (all labs ordered are listed, but only abnormal results are displayed)  Labs Reviewed - No data to display ____________________________________________  EKG  None ____________________________________________  RADIOLOGY  ED MD interpretation: None  Official radiology report(s): No results found.  ____________________________________________   PROCEDURES  Procedure(s) performed (including Critical Care):  Procedures   ____________________________________________   INITIAL IMPRESSION / ASSESSMENT AND PLAN / ED COURSE  As part of my medical decision making, I reviewed the following data within the [place] notes reviewed and incorporated, Old chart reviewed and Notes from  prior ED visits     Earlyne Alexus Disbro was evaluated in Emergency Department on 11/30/2019 for the symptoms described in the history of present illness. She was evaluated in the context of the global COVID-19 pandemic, which necessitated consideration that the patient might be at risk for infection with the SARS-CoV-2 virus that causes COVID-19. Institutional protocols and algorithms that pertain to the evaluation of patients at risk for COVID-19 are in a state of rapid change based on information released by regulatory bodies including the CDC and federal and state organizations. These policies and algorithms were followed during the patient's care in the ED.    29 year old female presenting with a 3-week history of nighttime urticaria; tonight with lower lip swelling.  Differential diagnosis includes but is not limited to environmental, food, drug allergies, etc.  Will administer EpiPen and IV allergic reaction cocktail to include Benadryl, Solu-Medrol, Pepcid and fluids.  Will monitor in the ED x3 hours.  Clinical Course as of Nov 30 351  Wed Nov 30, 2019  0350 Lower lip swelling improved.  Room air saturations 99%.  Will discharge home on prednisone, Pepcid and encourage patient to continue Benadryl as needed.  Will refer her to ENT for allergy testing.  Strict return precautions given.  Patient verbalizes understanding and agrees with plan of care.   [JS]    Clinical Course User Index [JS] Paulette Blanch, MD     ____________________________________________   FINAL CLINICAL IMPRESSION(S) / ED DIAGNOSES  Final diagnoses:  Allergic reaction, initial encounter  Urticaria  Angioedema, initial encounter     ED Discharge Orders         Ordered    predniSONE (DELTASONE) 20 MG tablet     Discontinue  Reprint     11/30/19 0352    famotidine (PEPCID) 20 MG tablet  2 times daily     Discontinue  Reprint     11/30/19 0352    EPINEPHrine 0.3 mg/0.3 mL IJ SOAJ injection   Once      Discontinue  Reprint     11/30/19 0352           Note:  This document was prepared using Dragon voice recognition software and may include unintentional dictation errors.   Paulette Blanch, MD 11/30/19 [phone]

## 2019-11-30 NOTE — Discharge Instructions (Signed)
1. Take the following medicines for the next 4 days: Prednisone 60mg  daily Pepcid0mg  twice daily 2. Take Benadryl as needed for itching. 3. Use Epi-Pen in case of acute, life-threatening allergic reaction. 4. Return to the ER for worsening symptoms, persistent vomiting, difficulty breathing or other concerns.

## 2019-11-30 NOTE — ED Notes (Signed)
Patient discharged to home per MD order. Patient in stable condition, and deemed medically cleared by ED provider for discharge. Discharge instructions reviewed with patient/family using "Teach Back"; verbalized understanding of medication education and administration, and information about follow-up care. Denies further concerns. ° °

## 2019-11-30 NOTE — ED Notes (Signed)
Pt states that she started having an allergic reaction a couple hours ago to an unknown substance. Patient states she took benadryl but then came here. Pt has swelling of lips, hives on upper body and arms, and redness. Pt denies difficulty breathing or swallowing.

## 2019-12-30 ENCOUNTER — Telehealth: Payer: Self-pay | Admitting: Physician Assistant

## 2019-12-30 DIAGNOSIS — B009 Herpesviral infection, unspecified: Secondary | ICD-10-CM

## 2019-12-30 MED ORDER — ACYCLOVIR 800 MG PO TABS: 800.0000 mg | ORAL_TABLET | Freq: Every day | ORAL | 2 refills | Status: DC

## 2019-12-30 NOTE — Telephone Encounter (Signed)
Received voice message from patient requesting a call to her.  Call to patient today at 4:35pm.  Patient requests refills of Acyclovir 800 mg #30 1 po daily for 2 months and confirms that it should be sent to [place].  Will send Rx refills per patient request.

## 2020-03-16 ENCOUNTER — Other Ambulatory Visit: Payer: Self-pay | Admitting: Physician Assistant

## 2020-03-16 DIAGNOSIS — B009 Herpesviral infection, unspecified: Secondary | ICD-10-CM

## 2020-03-16 MED ORDER — ACYCLOVIR 800 MG PO TABS: 800.0000 mg | ORAL_TABLET | Freq: Every day | ORAL | 6 refills | Status: DC
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
## 2020-04-04 ENCOUNTER — Other Ambulatory Visit: Payer: Self-pay

## 2020-04-04 ENCOUNTER — Encounter: Payer: Self-pay | Admitting: Physician Assistant

## 2020-04-04 ENCOUNTER — Ambulatory Visit: Payer: Medicaid Other | Admitting: Physician Assistant

## 2020-04-04 DIAGNOSIS — Z113 Encounter for screening for infections with a predominantly sexual mode of transmission: Secondary | ICD-10-CM | POA: Diagnosis not present

## 2020-04-04 LAB — WET PREP FOR TRICH, YEAST, CLUE
Trichomonas Exam: NEGATIVE
Yeast Exam: NEGATIVE

## 2020-04-04 NOTE — Progress Notes (Signed)
[facility] Department STI clinic/screening visit  Subjective:  Kimberly Carney is a 29 y.o. female being seen today for an STI screening visit. The patient reports they do have symptoms.  Patient reports that they do not desire a pregnancy in the next year.   They reported they are not interested in discussing contraception today.  Patient's last menstrual period was 04/01/2020.   Patient has the following medical conditions:   Patient Active Problem List   Diagnosis Date Noted  . 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 Smoker 3-7 cpd 06/14/2019  . 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 Kidney stones 05/10/2015  . Liver lesion 05/10/2015  . 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 Renal lesion 05/10/2015  . Pyelonephritis 05/10/2015    Chief Complaint  Patient presents with  . SEXUALLY TRANSMITTED DISEASE    screening    HPI  Patient reports that she was having some vaginal irritation before her period started that has persisted since then.  Denies other symptoms, regular medicines and surgeries.  States her last HIV test was 09/2019 and last pap was around then as well.   See flowsheet for further details and programmatic requirements.    The following portions of the patient's history were reviewed and updated as appropriate: allergies, current medications, past medical history, past social history, past surgical history and problem list.  Objective:  There were no vitals filed for this visit.  Physical Exam Constitutional:      General: She is not in acute distress.    Appearance: Normal appearance.  HENT:     Head: Normocephalic and atraumatic.     Comments: No nits,lice, or hair loss. No cervical, supraclavicular or axillary adenopathy.    Mouth/Throat:     Mouth: Mucous membranes are moist.     Pharynx: Oropharynx is clear. No oropharyngeal exudate or posterior oropharyngeal erythema.  Eyes:     Conjunctiva/sclera: Conjunctivae normal.  Pulmonary:     Effort:  Pulmonary effort is normal.  Abdominal:     Palpations: Abdomen is soft. There is no mass.     Tenderness: There is no abdominal tenderness. There is no guarding or rebound.  Genitourinary:    General: Normal vulva.     Rectum: Normal.     Comments: External genitalia/pubic area without nits, lice, edema, erythema, lesions and inguinal adenopathy. Vagina with normal mucosa and small amount of menstrual blood present as discharge. Cervix without visible lesions. Uterus firm, mobile, nt, no masses, no CMT, no adnexal tenderness or fullness. Musculoskeletal:     Cervical back: Neck supple. No tenderness.  Skin:    General: Skin is warm and dry.     Findings: No bruising, erythema, lesion or rash.  Neurological:     Mental Status: She is alert and oriented to person, place, and time.  Psychiatric:        Mood and Affect: Mood normal.        Behavior: Behavior normal.        Thought Content: Thought content normal.        Judgment: Judgment normal.      Assessment and Plan:  Kimberly Carney is a 29 y.o. female presenting to the [facility] Department for STI screening  1. Screening for STD (sexually transmitted disease) Patient into clinic with symptoms. Counsel patient that she can use OTC antifungal cream for symptom relief while waiting on other results. Rec condoms with all sex. Await test results.  Counseled that RN will call if  needs to RTC for treatment once results are back. - WET PREP FOR [place], YEAST, [place] [facility] - Syphilis Serology, [facility]     No follow-ups on file.  Future Appointments  Date Time Provider [place]  04/11/2020  9:00 AM AC-FP PROVIDER AC-FAM None  11/07/2020 11:00 AM [facility] CLINIC [facility] None    Jerene Dilling, [place]

## 2020-04-04 NOTE — Progress Notes (Signed)
Post:  RN reviewed wet mount with patient. No tx per S.O. Patient instructed to use OTC yeast cream for any irritation until other results are ready. Provider orders complete.  Deri Fuelling, RN

## 2020-04-11 ENCOUNTER — Ambulatory Visit: Payer: Self-pay

## 2020-04-12 ENCOUNTER — Emergency Department
Admission: EM | Admit: 2020-04-12 | Discharge: 2020-04-12 | Disposition: A | Discharge status: Home / Self Care | Payer: HRSA Program | Attending: Emergency Medicine | Admitting: Emergency Medicine

## 2020-04-12 ENCOUNTER — Encounter: Payer: Self-pay | Admitting: Emergency Medicine

## 2020-04-12 ENCOUNTER — Other Ambulatory Visit: Payer: Self-pay

## 2020-04-12 DIAGNOSIS — R059 Cough, unspecified: Secondary | ICD-10-CM | POA: Diagnosis present

## 2020-04-12 DIAGNOSIS — F17210 Nicotine dependence, cigarettes, uncomplicated: Secondary | ICD-10-CM | POA: Insufficient documentation

## 2020-04-12 DIAGNOSIS — U071 COVID-19: Secondary | ICD-10-CM | POA: Diagnosis not present

## 2020-04-12 LAB — POC SARS CORONAVIRUS 2 AG -  ED: SARS Coronavirus 2 Ag: POSITIVE — AB

## 2020-04-12 MED ORDER — PSEUDOEPH-BROMPHEN-DM 30-2-10 MG/5ML PO SYRP: 5.0000 mL | ORAL_SOLUTION | Freq: Four times a day (QID) | ORAL | 0 refills | Status: AC | PRN

## 2020-04-12 MED ORDER — ACETAMINOPHEN 325 MG PO TABS
650.0000 mg | ORAL_TABLET | Freq: Once | ORAL | Status: AC | PRN
  Administered 2020-04-12: 650 mg via ORAL
  Filled 2020-04-12: qty 2

## 2020-04-12 MED ORDER — IBUPROFEN 600 MG PO TABS: 600.0000 mg | ORAL_TABLET | Freq: Three times a day (TID) | ORAL | 0 refills | Status: AC | PRN

## 2020-04-12 NOTE — ED Notes (Signed)
Pt c/o cough with congestion, body aches, HA and dizziness that started yesterday. Pt is in NAD, ambulatory to exam room without difficulty

## 2020-04-12 NOTE — Discharge Instructions (Signed)
Follow discharge care instruction and self quarantine per CDC recommendation for 10 days.  Advised to consider taking the flu shot and COVID-19 vaccine after quarantine.  Take medication as directed

## 2020-04-12 NOTE — ED Provider Notes (Signed)
[facility] Emergency Department Provider Note   ____________________________________________   Event Date/Time   First MD Initiated Contact with Patient 04/12/20 [phone]     (approximate)  I have reviewed the triage vital signs and the nursing notes.   HISTORY  Chief Complaint Cough, Fever, and Generalized Body Aches    HPI Kimberly Carney is a 29 y.o. female patient presents with cough, body aches, headache, fever.  Onset of complaint was yesterday.  Patient stated no known contact with COVID-19 or recent travel.  Patient has not taken the Covid vaccine or flu shot for the season.         Past Medical History:  Diagnosis Date  . Kidney stone     Patient Active Problem List   Diagnosis Date Noted  . 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 Smoker 3-7 cpd 06/14/2019  . 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 Kidney stones 05/10/2015  . Liver lesion 05/10/2015  . 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 Renal lesion 05/10/2015  . Pyelonephritis 05/10/2015    Past Surgical History:  Procedure Laterality Date  . none      Prior to Admission medications   Medication Sig Start Date End Date Taking? Authorizing Provider  brompheniramine-pseudoephedrine-DM 30-2-10 MG/5ML syrup Take 5 mLs by mouth 4 (four) times daily as needed. 04/12/20  Yes Joni Reining, PA-C  ibuprofen (ADVIL) 600 MG tablet Take 1 tablet (600 mg total) by mouth every 8 (eight) hours as needed. 04/12/20  Yes Joni Reining, PA-C  acyclovir (ZOVIRAX) 800 MG tablet Take 1 tablet (800 mg total) by mouth daily. 09/27/19   Matt Holmes, PA  acyclovir (ZOVIRAX) 800 MG tablet Take 1 tablet (800 mg total) by mouth daily. 10/07/19   Matt Holmes, PA  acyclovir (ZOVIRAX) 800 MG tablet Take 1 tablet (800 mg total) by mouth daily. 11/15/19   Matt Holmes, PA  acyclovir (ZOVIRAX) 800 MG tablet Take 1 tablet (800 mg total) by mouth daily. 12/30/19   Matt Holmes, PA  acyclovir (ZOVIRAX) 800 MG  tablet Take 1 tablet (800 mg total) by mouth daily. 03/16/20   Matt Holmes, PA  famotidine (PEPCID) 20 MG tablet Take 1 tablet (20 mg total) by mouth 2 (two) times daily. 11/30/19   Irean Hong, MD  predniSONE (DELTASONE) 20 MG tablet 3 tablets daily x 4 days 11/30/19   Irean Hong, MD    Allergies Patient has no known allergies.  Family History  Problem Relation Age of Onset  . Healthy Mother   . Healthy Father   . Diabetes Paternal Grandmother   . Colon cancer Paternal Grandfather   . Kidney disease Neg Hx   . Bladder Cancer Neg Hx     Social History Social History   Tobacco Use  . Smoking status: Current Every Day Smoker    Packs/day: 0.00    Types: Cigarettes  . Smokeless tobacco: Never Used  Vaping Use  . Vaping Use: Never used  Substance Use Topics  . Alcohol use: Yes    Alcohol/week: 1.0 standard drink    Types: 1 Standard drinks or equivalent per week    Comment: q weekend  . Drug use: No    Review of Systems Constitutional: No fever/chills.  Body ache Eyes: No visual changes. ENT: No sore throat. Cardiovascular: Denies chest pain. Respiratory: shortness of breath.  Nonproductive cough Gastrointestinal: No abdominal pain.  No nausea, no vomiting.  No diarrhea.  No constipation. Genitourinary:  Negative for dysuria. Musculoskeletal: Negative for back pain. Skin: Negative for rash. Neurological: Positive for headaches, but denies focal weakness or numbness.   ____________________________________________   PHYSICAL EXAM:  VITAL SIGNS: ED Triage Vitals  Enc Vitals Group     BP 04/12/20 0710 112/81     Pulse Rate 04/12/20 0710 (!) 118     Resp 04/12/20 0710 20     Temp 04/12/20 0710 (!) 101.4 F (38.6 C)     Temp Source 04/12/20 0710 Oral     SpO2 04/12/20 0710 93 %     Weight 04/12/20 0711 138 lb (62.6 kg)     Height 04/12/20 0711 5\' 4"  (1.626 m)     Head Circumference --      Peak Flow --      Pain Score 04/12/20 0711 8     Pain Loc --       Pain Edu? --      Excl. in [place]? --    Constitutional: Febrile.  Alert and oriented. Well appearing and in no acute distress. Nose: No congestion/rhinnorhea. Mouth/Throat: Mucous membranes are moist.  Oropharynx non-erythematous. Neck: No stridor.  Hematological/Lymphatic/Immunilogical: No cervical lymphadenopathy. Cardiovascular: Normal rate, regular rhythm. Grossly normal heart sounds.  Good peripheral circulation. Respiratory: Normal respiratory effort.  No retractions. Lungs CTAB. Gastrointestinal: Soft and nontender. No distention. No abdominal bruits. No CVA tenderness. Genitourinary: Deferred Musculoskeletal: No lower extremity tenderness nor edema.  No joint effusions. Neurologic:  Normal speech and language. No gross focal neurologic deficits are appreciated. No gait instability. Skin:  Skin is warm, dry and intact. No rash noted. Psychiatric: Mood and affect are normal. Speech and behavior are normal.  ____________________________________________   LABS (all labs ordered are listed, but only abnormal results are displayed)  Labs Reviewed  POC SARS CORONAVIRUS 2 AG -  ED - Abnormal; Notable for the following components:      Result Value   SARS Coronavirus 2 Ag POSITIVE (*)    All other components within normal limits   ____________________________________________  EKG   ____________________________________________  RADIOLOGY I, Sable Feil, personally viewed and evaluated these images (plain radiographs) as part of my medical decision making, as well as reviewing the written report by the radiologist.  ED MD interpretation:    Official radiology report(s): No results found.  ____________________________________________   PROCEDURES  Procedure(s) performed (including Critical Care):  Procedures   ____________________________________________   INITIAL IMPRESSION / ASSESSMENT AND PLAN / ED COURSE  As part of my medical decision making, I reviewed the  following data within the [place]         Patient presents with 2 days of cough, body aches, headache, and fever.  Patient test positive for COVID-19.  Patient given discharge care instructions for self quarantine.  Take medication as directed.  Return to ED if condition worsens.  Recommend Covid vaccine and flu shot after quarantine.      ____________________________________________   FINAL CLINICAL IMPRESSION(S) / ED DIAGNOSES  Final diagnoses:  [id number]     ED Discharge Orders         Ordered    brompheniramine-pseudoephedrine-DM 30-2-10 MG/5ML syrup  4 times daily PRN        04/12/20 0815    ibuprofen (ADVIL) 600 MG tablet  Every 8 hours PRN        04/12/20 0815          *Please note:  Danyeal Alexus Ransier was evaluated in Emergency Department on  04/12/2020 for the symptoms described in the history of present illness. She was evaluated in the context of the global COVID-19 pandemic, which necessitated consideration that the patient might be at risk for infection with the SARS-CoV-2 virus that causes COVID-19. Institutional protocols and algorithms that pertain to the evaluation of patients at risk for COVID-19 are in a state of rapid change based on information released by regulatory bodies including the CDC and federal and state organizations. These policies and algorithms were followed during the patient's care in the ED.  Some ED evaluations and interventions may be delayed as a result of limited staffing during and the pandemic.*   Note:  This document was prepared using Dragon voice recognition software and may include unintentional dictation errors.    Sable Feil, PA-C 04/12/20 [id number]    Arta Silence, MD 04/12/20 1041

## 2020-04-12 NOTE — ED Triage Notes (Signed)
Pt comes into the ED via POV c/o cough, body aches, headache, and fevers.  Pt states her symptoms started yesterday morning.  Pt denies knowingly being exposed to anyone with COVID at this time.  Pt currently has even and unlabored respirations. Pt does admit to having SHOB and some lightheadedness with her other symptoms.

## 2020-05-07 ENCOUNTER — Other Ambulatory Visit: Payer: Self-pay | Admitting: Physician Assistant

## 2020-05-16 ENCOUNTER — Ambulatory Visit: Payer: Medicaid Other

## 2020-08-08 ENCOUNTER — Telehealth: Payer: Self-pay | Admitting: Physician Assistant

## 2020-08-08 NOTE — Telephone Encounter (Signed)
Call to patient at [phone].  Counseled patient that I have sent an order for refills that is good through the beginning of December of this year.  Patient states that the last time she picked up a refill, the bottle had on it that she had no refills left.   Counseled patient that I will call the pharmacy to check and make sure that they have the correct order.   Call to [facility] at [phone].  Spoke with Demetri and he confirms that she does have refills left on her prescription.

## 2020-11-07 ENCOUNTER — Ambulatory Visit: Payer: Medicaid Other | Attending: Oncology

## 2020-11-26 ENCOUNTER — Ambulatory Visit: Payer: Medicaid Other

## 2021-01-21 ENCOUNTER — Encounter: Payer: Self-pay | Admitting: Advanced Practice Midwife

## 2021-01-21 ENCOUNTER — Ambulatory Visit (LOCAL_COMMUNITY_HEALTH_CENTER): Payer: No Typology Code available for payment source | Admitting: Advanced Practice Midwife

## 2021-01-21 ENCOUNTER — Other Ambulatory Visit: Payer: Self-pay

## 2021-01-21 VITALS — BP 115/79 | HR 85 | Ht 64.0 in | Wt 131.0 lb

## 2021-01-21 DIAGNOSIS — N730 Acute parametritis and pelvic cellulitis: Secondary | ICD-10-CM | POA: Diagnosis not present

## 2021-01-21 DIAGNOSIS — F17200 Nicotine dependence, unspecified, uncomplicated: Secondary | ICD-10-CM | POA: Diagnosis not present

## 2021-01-21 DIAGNOSIS — U071 COVID-19: Secondary | ICD-10-CM | POA: Insufficient documentation

## 2021-01-21 DIAGNOSIS — Z3009 Encounter for other general counseling and advice on contraception: Secondary | ICD-10-CM | POA: Diagnosis not present

## 2021-01-21 DIAGNOSIS — Z01419 Encounter for gynecological examination (general) (routine) without abnormal findings: Secondary | ICD-10-CM

## 2021-01-21 DIAGNOSIS — F329 Major depressive disorder, single episode, unspecified: Secondary | ICD-10-CM

## 2021-01-21 DIAGNOSIS — F32A Depression, unspecified: Secondary | ICD-10-CM | POA: Insufficient documentation

## 2021-01-21 DIAGNOSIS — R87619 Unspecified abnormal cytological findings in specimens from cervix uteri: Secondary | ICD-10-CM | POA: Insufficient documentation

## 2021-01-21 LAB — WET PREP FOR TRICH, YEAST, CLUE
Trichomonas Exam: NEGATIVE
Yeast Exam: NEGATIVE

## 2021-01-21 LAB — HEMOGLOBIN, FINGERSTICK: Hemoglobin: 11.3 g/dL (ref 11.1–15.9)

## 2021-01-21 NOTE — Progress Notes (Signed)
Wet mount and hgb reviewed - no interventions required per standing order. Rich Number, RN

## 2021-01-21 NOTE — Progress Notes (Signed)
[facility] [place] Number: [phone]    Family Planning Visit- Initial Visit  Subjective:  Kimberly Carney is a 30 y.o. smoker [id number]   being seen today for an initial annual visit and to discuss contraceptive options.  The patient is currently using None for pregnancy prevention. Patient reports she does want a pregnancy in the next year.  Patient has the following medical conditions has Kidney stones; Liver lesion; Notalgia; Renal lesion; Pyelonephritis; Smoker 3-7 cpd; HSV-2 infection; Hemoglobin E disease ([place]); PID (acute pelvic inflammatory disease) 11/29/2018; and COVID-19  01/01/21 on their problem list.  Chief Complaint  Patient presents with   Annual Exam    STI testing    Patient reports here for physical. Wants to be pregnant in next 12 mo. LMP 12/18/20. Last sex 01/19/21 without condom; with current partner x 6 mo; 1 partner in last 3 mo. Smoking 3-10 cpd. Last MJ age 3. Last ETOH 01/20/21 (6 shots Tequila) q weekend.  Last pap 11/09/19 neg HPV +.  Working 40 hrs/wk and not in school. Living with her brother. +cry 1x/mo, +moody, irritable, poor sleep, appetite up and down, +anhedonia, -SI/HI, declines counseling due to work schedule  Patient denies vaping, cigars  Body mass index is 22.49 kg/m. - Patient is eligible for diabetes screening based on BMI and age >09?  not applicable WJ1B ordered? no  Patient reports 2  partner/s in last year. Desires STI screening?  Yes  Has patient been screened once for HCV in the past?  No  No results found for: HCVAB  Does the patient have current drug use (including MJ), have a partner with drug use, and/or has been incarcerated since last result? No  If yes-- Screen for HCV through [facility]   Does the patient meet criteria for HBV testing? No  Criteria:  -Household, sexual or needle sharing contact with HBV -History of drug use -HIV positive -Those  with known Hep C   Health Maintenance Due  Topic Date Due   COVID-19 Vaccine (1) Never done   Hepatitis C Screening  Never done   TETANUS/TDAP  Never done   INFLUENZA VACCINE  Never done    Review of Systems  Constitutional:  Positive for weight loss (20 lb wt loss in 2 mo, not intentional; had covid 12/2020).  Neurological:  Positive for headaches (qoday in different places on head relieved with naps).  All other systems reviewed and are negative.  The following portions of the patient's history were reviewed and updated as appropriate: allergies, current medications, past family history, past medical history, past social history, past surgical history and problem list. Problem list updated.   See flowsheet for other program required questions.  Objective:   Vitals:   01/21/21 1057  BP: 115/79  Pulse: 85  Weight: 131 lb (59.4 kg)  Height: 5\' 4"  (1.626 m)    Physical Exam Constitutional:      Appearance: Normal appearance. She is normal weight.  HENT:     Head: Normocephalic and atraumatic.     Mouth/Throat:     Mouth: Mucous membranes are moist.     Comments: Last dental exam 2021; urged exam asap Eyes:     Conjunctiva/sclera: Conjunctivae normal.  Cardiovascular:     Rate and Rhythm: Normal rate and regular rhythm.  Pulmonary:     Effort: Pulmonary effort is normal.     Breath sounds: Normal breath sounds.  Chest:  Breasts:    Right: Normal.     Left: Normal.  Abdominal:     General: Abdomen is flat.     Palpations: Abdomen is soft.     Comments: Soft without masses or tenderness, good tone  Genitourinary:    General: Normal vulva.     Exam position: Lithotomy position.     Vagina: Vaginal discharge (white creamy leukorrhea, ph<4.5) and erythema (sl erythema) present.     Cervix: Normal.     Uterus: Normal.      Adnexa: Right adnexa normal and left adnexa normal.     Rectum: Normal.     Comments: Pap done Musculoskeletal:        General: Normal range  of motion.     Cervical back: Normal range of motion and neck supple.  Lymphadenopathy:     Cervical:     Right cervical: No superficial, deep or posterior cervical adenopathy.    Left cervical: No superficial, deep or posterior cervical adenopathy.  Skin:    General: Skin is warm and dry.  Neurological:     Mental Status: She is alert.  Psychiatric:        Mood and Affect: Mood normal.      Assessment and Plan:  Kimberly Carney is a 30 y.o. female presenting to the [facility] Department for an initial annual wellness/contraceptive visit  Contraception counseling: Reviewed all forms of birth control options in the tiered based approach. available including abstinence; over the counter/barrier methods; hormonal contraceptive medication including pill, patch, ring, injection,contraceptive implant, ECP; hormonal and nonhormonal IUDs; permanent sterilization options including vasectomy and the various tubal sterilization modalities. Risks, benefits, and typical effectiveness rates were reviewed.  Questions were answered.  Written information was also given to the patient to review.  Patient desires nothing, this was prescribed for patient. She will follow up in  prn for surveillance.  She was told to call with any further questions, or with any concerns about this method of contraception.  Emphasized use of condoms 100% of the time for STI prevention.  Patient was offered ECP. ECP was not accepted by the patient. ECP counseling was not given - see RN documentation  1. Family planning Please give dental list to pt Please give primary care MD list to pt Please give Milton Ferguson, LCSW contact info to pt Referred to primary care MD for numerous physical c/o  2. PID (acute pelvic inflammatory disease) 11/29/2018   3. Well woman exam with routine gynecological exam Treat wet mount per standing  orders Immunization nurse consult - WET PREP FOR [place], YEAST, CLUE -  Hemoglobin, venipuncture - [facility] - Syphilis Serology, [facility] - IGP, [place] HPV  4. COVID-19  01/01/21      No follow-ups on file.  No future appointments.  Herbie Saxon, CNM

## 2021-01-27 LAB — IGP, APTIMA HPV
HPV Aptima: NEGATIVE
PAP Smear Comment: 0

## 2021-03-09 ENCOUNTER — Other Ambulatory Visit: Payer: Self-pay | Admitting: Physician Assistant

## 2021-03-09 DIAGNOSIS — B009 Herpesviral infection, unspecified: Secondary | ICD-10-CM

## 2021-03-13 NOTE — Telephone Encounter (Signed)
Per chart review, patient with history of HSV infection.  Recent annual visit so will renew Rx for 1 year.

## 2021-09-18 ENCOUNTER — Other Ambulatory Visit: Payer: Self-pay | Admitting: Physician Assistant

## 2021-09-18 DIAGNOSIS — B009 Herpesviral infection, unspecified: Secondary | ICD-10-CM

## 2021-09-19 ENCOUNTER — Other Ambulatory Visit: Payer: Self-pay | Admitting: Family Medicine

## 2021-09-19 DIAGNOSIS — B009 Herpesviral infection, unspecified: Secondary | ICD-10-CM

## 2021-09-19 MED ORDER — ACYCLOVIR 800 MG PO TABS: 800.0000 mg | ORAL_TABLET | Freq: Every day | ORAL | 6 refills | Status: DC

## 2021-09-19 NOTE — Progress Notes (Signed)
New RX sent to listed pharmacy d/t previous prescribing provider is inactive    Junious Dresser, FNP

## 2021-11-13 ENCOUNTER — Emergency Department: Payer: Medicaid Other

## 2021-11-13 ENCOUNTER — Encounter: Payer: Self-pay | Admitting: Emergency Medicine

## 2021-11-13 DIAGNOSIS — D72829 Elevated white blood cell count, unspecified: Secondary | ICD-10-CM | POA: Insufficient documentation

## 2021-11-13 DIAGNOSIS — B349 Viral infection, unspecified: Secondary | ICD-10-CM | POA: Insufficient documentation

## 2021-11-13 LAB — BASIC METABOLIC PANEL
Anion gap: 8 (ref 5–15)
BUN: 14 mg/dL (ref 6–20)
CO2: 23 mmol/L (ref 22–32)
Calcium: 9.1 mg/dL (ref 8.9–10.3)
Chloride: 107 mmol/L (ref 98–111)
Creatinine, Ser: 0.7 mg/dL (ref 0.44–1.00)
GFR, Estimated: >60 mL/min (ref >60)
Glucose, Bld: 96 mg/dL (ref 70–99)
Potassium: 3.5 mmol/L (ref 3.5–5.1)
Sodium: 138 mmol/L (ref 135–145)

## 2021-11-13 LAB — CBC
HCT: 36.2 % (ref 36.0–46.0)
Hemoglobin: 11.8 g/dL — ABNORMAL LOW (ref 12.0–15.0)
MCH: 19.9 pg — ABNORMAL LOW (ref 26.0–34.0)
MCHC: 32.6 g/dL (ref 30.0–36.0)
MCV: 60.9 fL — ABNORMAL LOW (ref 80.0–100.0)
Platelets: 301 10*3/uL (ref 150–400)
RBC: 5.94 MIL/uL — ABNORMAL HIGH (ref 3.87–5.11)
RDW: 15.6 % — ABNORMAL HIGH (ref 11.5–15.5)
WBC: 12.8 10*3/uL — ABNORMAL HIGH (ref 4.0–10.5)
nRBC: 0 % (ref 0.0–0.2)

## 2021-11-13 LAB — GROUP A STREP BY PCR: Group A Strep by PCR: NOT DETECTED

## 2021-11-13 LAB — POC URINE PREG, ED: Preg Test, Ur: NEGATIVE

## 2021-11-13 LAB — TROPONIN I (HIGH SENSITIVITY): Troponin I (High Sensitivity): <2 ng/L (ref <18)

## 2021-11-13 NOTE — ED Triage Notes (Signed)
Pt presents via POV with complaints of a sore throat and chest tightness for the last week. Pt also endorses dizziness since the sx started. Denies SOB, fevers, or chills.

## 2021-11-14 ENCOUNTER — Emergency Department
Admission: EM | Admit: 2021-11-14 | Discharge: 2021-11-14 | Disposition: A | Discharge status: Home / Self Care | Payer: Medicaid Other | Attending: Emergency Medicine | Admitting: Emergency Medicine

## 2021-11-14 DIAGNOSIS — B349 Viral infection, unspecified: Secondary | ICD-10-CM

## 2021-11-14 NOTE — ED Provider Notes (Signed)
[facility] Provider Note    Event Date/Time   First MD Initiated Contact with Patient 11/14/21 0133     (approximate)   History   Sore Throat and Chest Pain   HPI  Kimberly Carney is a 31 y.o. female who denies having any chronic medical issues.  She presents tonight for about a week of mild cough and several days of sore throat and chest tightness.  Some shortness of breath associated with exertion.  Chills, no fever.  Some nausea, 1 episode of vomiting, no nausea currently.  No abdominal pain.  No neck pain or stiffness but occasional headache.  No visual changes.  She denies dysuria.  She reports that she is unvaccinated against COVID-19.  No known sick contacts.     Physical Exam   Triage Vital Signs: ED Triage Vitals  Enc Vitals Group     BP 11/13/21 2214 127/80     Pulse Rate 11/13/21 2214 87     Resp 11/13/21 2214 16     Temp 11/13/21 2214 98.7 F (37.1 C)     Temp Source 11/13/21 2214 Oral     SpO2 11/13/21 2214 96 %     Weight 11/13/21 2213 62.6 kg (138 lb)     Height 11/13/21 2213 1.626 m ('5\' 4"'$ )     Head Circumference --      Peak Flow --      Pain Score 11/13/21 2221 5     Pain Loc --      Pain Edu? --      Excl. in [place]? --     Most recent vital signs: Vitals:   11/13/21 2214 11/14/21 0217  BP: 127/80 105/80  Pulse: 87 70  Resp: 16 15  Temp: 98.7 F (37.1 C) 97.9 F (36.6 C)  SpO2: 96% 100%     General: Awake, no distress.  Well-appearing. CV:  Good peripheral perfusion.  Normal heart sounds. Resp:  Normal effort.  Lungs are clear to auscultation bilaterally. Abd:  No distention.  No tenderness to palpation. Other:  Oropharynx is clear, no erythema, exudate, nor petechiae.   ED Results / Procedures / Treatments   Labs (all labs ordered are listed, but only abnormal results are displayed) Labs Reviewed  CBC - Abnormal; Notable for the following components:      Result Value   WBC 12.8 (*)    RBC 5.94  (*)    Hemoglobin 11.8 (*)    MCV 60.9 (*)    MCH 19.9 (*)    RDW 15.6 (*)    All other components within normal limits  GROUP A STREP BY PCR  BASIC METABOLIC PANEL  POC URINE PREG, ED  TROPONIN I (HIGH SENSITIVITY)     EKG  ED ECG REPORT I, Hinda Kehr, the attending physician, personally viewed and interpreted this ECG.  Date: 11/13/2021 EKG Time: 22: 18 Rate: 82 Rhythm: normal sinus rhythm with sinus arrhythmia QRS Axis: normal Intervals: normal ST/T Wave abnormalities: normal Narrative Interpretation: no evidence of acute ischemia    RADIOLOGY I viewed and interpreted the patient's two-view chest x-ray.  I see no evidence of consolidation, pneumonia including viral pattern, nor pneumothorax.  I also read the radiologist's report, which confirmed no acute findings.    PROCEDURES:  Critical Care performed: No  Procedures   MEDICATIONS ORDERED IN ED: Medications - No data to display   IMPRESSION / MDM / [place] / ED COURSE  I reviewed  the triage vital signs and the nursing notes.                              Differential diagnosis includes, but is not limited to, viral illness, community-acquired pneumonia, ACS, PE, pneumothorax.  Patient's presentation is most consistent with acute illness / injury with system symptoms.  Labs/studies ordered include: EKG, two-view chest x-ray, CBC, basic metabolic panel, high-sensitivity troponin, urine pregnancy test, group A strep PCR.  Fortunately the patient's work-up is reassuring.  Vital signs are normal.  EKG is normal with no sign of ischemia.  Mild leukocytosis of 12.8, otherwise normal CBC.  Rest of her labs are within normal limits.  Chest x-ray shows no evidence of pneumonia.  I suspect [id number], particularly given that the prevalence seems to be increasing and she is unvaccinated.  However she is declining COVID testing.  She has no evidence of life-threatening medical condition or that she  requires additional testing or treatment.  I recommended symptomatic treatment.  I suggested writing a prescription for nausea medicine but she declined.  She has no PCP and I provided information about how to establish a primary care at [facility] clinic.  I gave my usual and customary return precautions.       FINAL CLINICAL IMPRESSION(S) / ED DIAGNOSES   Final diagnoses:  Viral illness     Rx / DC Orders   ED Discharge Orders     None        Note:  This document was prepared using Dragon voice recognition software and may include unintentional dictation errors.   Hinda Kehr, MD 11/14/21 [phone]

## 2021-11-14 NOTE — Discharge Instructions (Signed)
Your workup in the Emergency Department today was reassuring.  We did not find any specific abnormalities.  We recommend you drink plenty of fluids, take your regular medications and/or any new ones prescribed today, and follow up with the doctor(s) listed in these documents as recommended.  Return to the Emergency Department if you develop new or worsening symptoms that concern you.  

## 2022-04-02 ENCOUNTER — Ambulatory Visit: Payer: Medicaid Other

## 2022-06-02 ENCOUNTER — Encounter: Payer: Self-pay | Admitting: Family Medicine

## 2022-06-02 ENCOUNTER — Ambulatory Visit: Payer: 59 | Admitting: Family Medicine

## 2022-06-02 VITALS — BP 103/72 | HR 82 | Ht 64.0 in | Wt 135.0 lb

## 2022-06-02 DIAGNOSIS — Z3202 Encounter for pregnancy test, result negative: Secondary | ICD-10-CM

## 2022-06-02 DIAGNOSIS — Z113 Encounter for screening for infections with a predominantly sexual mode of transmission: Secondary | ICD-10-CM

## 2022-06-02 DIAGNOSIS — Z3009 Encounter for other general counseling and advice on contraception: Secondary | ICD-10-CM

## 2022-06-02 DIAGNOSIS — Z30013 Encounter for initial prescription of injectable contraceptive: Secondary | ICD-10-CM

## 2022-06-02 DIAGNOSIS — Z01419 Encounter for gynecological examination (general) (routine) without abnormal findings: Secondary | ICD-10-CM

## 2022-06-02 DIAGNOSIS — R87619 Unspecified abnormal cytological findings in specimens from cervix uteri: Secondary | ICD-10-CM

## 2022-06-02 LAB — WET PREP FOR TRICH, YEAST, CLUE
Trichomonas Exam: NEGATIVE
Yeast Exam: NEGATIVE

## 2022-06-02 LAB — HM HIV SCREENING LAB: HM HIV Screening: NEGATIVE

## 2022-06-02 LAB — PREGNANCY, URINE: Preg Test, Ur: NEGATIVE

## 2022-06-02 MED ORDER — MEDROXYPROGESTERONE ACETATE 150 MG/ML IM SUSP
150.0000 mg | INTRAMUSCULAR | Status: AC
  Administered 2022-06-02: 150 mg via INTRAMUSCULAR

## 2022-06-02 NOTE — Progress Notes (Signed)
[facility] [place] Number: [phone]  Family Planning Visit- Repeat Yearly Visit  Subjective:  Kimberly Carney is a 32 y.o. G0P0000  being seen today for an annual wellness visit and to discuss contraception options.   The patient is currently using No Method - Other Reason for pregnancy prevention. Patient does not want a pregnancy in the next year.    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  Chief Complaint  Patient presents with   Contraception    P.E pap and std screening    Patient reports to clinic for PE and STD screening. Is unsure about becoming pregnant in the next year  See flowsheet for other program required questions.   Body mass index is 23.17 kg/m. - Patient is eligible for diabetes screening based on BMI> 25 and age >35?  no HA1C ordered? not applicable  Patient reports 1 of partners in last year. Desires STI screening?  Yes   Has patient been screened once for HCV in the past?  No  No results found for: "HCVAB"  Does the patient have current of drug use, have a partner with drug use, and/or has been incarcerated since last result? No  If yes-- Screen for HCV through [facility]   Does the patient meet criteria for HBV testing? No  Criteria:  -Household, sexual or needle sharing contact with HBV -History of drug use -HIV positive -Those with known Hep C   Health Maintenance Due  Topic Date Due   COVID-19 Vaccine (1) Never done   Hepatitis C Screening  Never done   DTaP/Tdap/Td (1 - Tdap) Never done   INFLUENZA VACCINE   Never done    Review of Systems  Reason unable to perform ROS: rectal bleeding.  Constitutional:  Negative for weight loss.  Eyes:  Negative for blurred vision.  Respiratory:  Negative for cough and shortness of breath.   Cardiovascular:  Negative for claudication.  Gastrointestinal:  Positive for nausea.  Genitourinary:  Negative for dysuria and frequency.  Skin:  Negative for rash.  Neurological:  Positive for headaches.  Endo/Heme/Allergies:  Does not bruise/bleed easily.    The following portions of the patient's history were reviewed and updated as appropriate: allergies, current medications, past family history, past medical history, past social history, past surgical history and problem list. Problem list updated.  Objective:   Vitals:   06/02/22 0902  BP: 103/72  Pulse: 82  Weight: 135 lb (61.2 kg)  Height: 5' 4"$  (1.626 m)    Physical Exam Vitals and nursing note reviewed.  Constitutional:      Appearance: Normal appearance.  HENT:     Head: Normocephalic and atraumatic.     Mouth/Throat:     Mouth: Mucous membranes are moist.     Pharynx: Oropharynx is clear. No oropharyngeal exudate or posterior oropharyngeal erythema.  Cardiovascular:     Rate and Rhythm: Normal rate and regular rhythm.  Pulmonary:     Effort: Pulmonary effort is normal.  Abdominal:     General: Abdomen is flat.     Palpations: There is no mass.  Tenderness: There is no abdominal tenderness. There is no rebound.  Genitourinary:    General: Normal vulva.     Exam position: Lithotomy position.     Pubic Area: No rash or pubic lice.      Labia:        Right: No rash or lesion.        Left: No rash or lesion.      Vagina: Normal. No vaginal discharge, erythema, bleeding or lesions.     Cervix: No cervical motion tenderness, discharge, friability, lesion or erythema.     Uterus: Normal.      Adnexa: Right adnexa normal and left adnexa normal.     Rectum: Normal.       Comments: pH =  4  1cm in diameter hemorrhoid visible on exam Lymphadenopathy:     Head:     Right side of head: No preauricular or posterior auricular adenopathy.     Left side of head: No preauricular or posterior auricular adenopathy.     Cervical: No cervical adenopathy.     Upper Body:     Right upper body: No supraclavicular, axillary or epitrochlear adenopathy.     Left upper body: No supraclavicular, axillary or epitrochlear adenopathy.     Lower Body: No right inguinal adenopathy. No left inguinal adenopathy.  Skin:    General: Skin is warm and dry.     Findings: No rash.  Neurological:     Mental Status: She is alert and oriented to person, place, and time.    Assessment and Plan:  Kimberly Carney is a 32 y.o. female Kimberly Carney presenting to the [facility] Department for an yearly wellness and contraception visit   Contraception counseling: Reviewed options based on patient desire and reproductive life plan. Patient is interested in Hormonal Injection. This was provided to the patient today.  Risks, benefits, and typical effectiveness rates were reviewed.  Questions were answered.  Written information was also given to the patient to review.    The patient will follow up in  3 months for surveillance.  The patient was told to call with any further questions, or with any concerns about this method of contraception.  Emphasized use of condoms 100% of the time for STI prevention.  Patient was assessed for need for ECP. Patient was offered ECP based on Unprotected sex within past 72 hours.  Patient is within 2 days of unprotected sex. Patient was offered ECP. Reviewed options and patient desired No method of ECP, declined all    1. Well woman exam with routine gynecological exam -Last CBE 01/2021, next due 01/2024 --pt c/o headache, nausea and rectal bleeding  -pt is a poor historian -pt states that for years she has had occasional dizziness that then results in a migraine  with nausea -pt states she has rectal bleeding that first started 1-2 years ago, occurs 1-2x/week, last occurred 1 week ago, states it is darker red, on the paper when she wipes but also in the toilet bowl -pt has a pertinent hx of cholecystectomy in Oct 2023, given that this c/o rectal bleeding started 1-2 years ago, I don't believe it to be a post op GI bleed -after exam- I believe this is a hemorrhoid- discussed that it usually comes from straining with constipation-- encouraged patient to use the list provided to get a PCP-- discussed that the patient can get referral to GI with continued rectal bleeding/concerns  2. Screening for venereal disease  - WET  PREP FOR [place], YEAST, CLUE - Chlamydia/Gonorrhea [place] [facility] - Syphilis Serology, [place] [facility] - HIV [place] [facility]  3. Family planning -decided she is unsure if she really wants to have a child in the next syear -encouraged to quit smoking, and etoh -encouraged PNV use- discussed importance for prevention of NTD -pt then states she does want depo- and wants to regulate her cycle that currently comes 1x/month. I explained that this is a regular cycle -Pt wants to have depo today- it was explained to pt that this might delay her fertility if she decides to stop depo and become pregnant  4. Abnormal cervical Papanicolaou smear, unspecified abnormal pap finding -next pap due 01/2024   Return in 3 months (on 08/31/2022), or if symptoms worsen or fail to improve, for Depo injection.  No future appointments.  Sharlet [place], [place]

## 2022-06-02 NOTE — Progress Notes (Signed)
Pt is here for PE, STD screening and BC.  Pt notified of wet mount results and negative UPT.  Depo 150 mg given IM in LUOQ,  pt tolerated well.  Pt given FP packet and reminder card to return in 11-13 weeks for next Depo injection.

## 2022-06-07 LAB — GONOCOCCUS CULTURE

## 2022-06-16 ENCOUNTER — Telehealth: Payer: Self-pay | Admitting: Family Medicine

## 2022-06-16 NOTE — Telephone Encounter (Signed)
Pt calls & requests a refill for Acyclovir to [facility].

## 2022-06-18 ENCOUNTER — Other Ambulatory Visit: Payer: Self-pay | Admitting: Family Medicine

## 2022-06-18 DIAGNOSIS — B009 Herpesviral infection, unspecified: Secondary | ICD-10-CM

## 2022-06-18 MED ORDER — ACYCLOVIR 800 MG PO TABS: 800.0000 mg | ORAL_TABLET | Freq: Every day | ORAL | 6 refills | Status: DC

## 2022-07-01 ENCOUNTER — Telehealth: Payer: Self-pay | Admitting: Family Medicine

## 2022-07-01 NOTE — Telephone Encounter (Signed)
Pt came last month and started DEPO, she got her period which has been longer and more abundant than before. She is concerned and would like for someone from the clinic to call her back. Thanks

## 2022-10-08 ENCOUNTER — Other Ambulatory Visit: Payer: Self-pay

## 2022-10-08 ENCOUNTER — Emergency Department
Admission: EM | Admit: 2022-10-08 | Discharge: 2022-10-08 | Disposition: A | Discharge status: Home / Self Care | Payer: BC Managed Care – PPO | Attending: Emergency Medicine | Admitting: Emergency Medicine

## 2022-10-08 ENCOUNTER — Ambulatory Visit: Payer: Self-pay

## 2022-10-08 DIAGNOSIS — R102 Pelvic and perineal pain: Secondary | ICD-10-CM | POA: Diagnosis not present

## 2022-10-08 DIAGNOSIS — R300 Dysuria: Secondary | ICD-10-CM | POA: Diagnosis present

## 2022-10-08 DIAGNOSIS — N3001 Acute cystitis with hematuria: Secondary | ICD-10-CM

## 2022-10-08 LAB — URINALYSIS, ROUTINE W REFLEX MICROSCOPIC
Bilirubin Urine: NEGATIVE
Glucose, UA: NEGATIVE mg/dL
Ketones, ur: NEGATIVE mg/dL
Leukocytes,Ua: NEGATIVE
Nitrite: NEGATIVE
Protein, ur: 100 mg/dL — AB
RBC / HPF: >50 RBC/hpf (ref 0–5)
Specific Gravity, Urine: 1.014 (ref 1.005–1.030)
pH: 7 (ref 5.0–8.0)

## 2022-10-08 LAB — CHLAMYDIA/NGC RT PCR (ARMC ONLY)
Chlamydia Tr: NOT DETECTED
N gonorrhoeae: NOT DETECTED

## 2022-10-08 LAB — POC URINE PREG, ED: Preg Test, Ur: NEGATIVE

## 2022-10-08 LAB — WET PREP, GENITAL: WBC, Wet Prep HPF POC: <10 (ref <10)

## 2022-10-08 MED ORDER — CEPHALEXIN 500 MG PO CAPS: 500.0000 mg | ORAL_CAPSULE | Freq: Three times a day (TID) | ORAL | 0 refills | Status: AC

## 2022-10-08 MED ORDER — CEFTRIAXONE SODIUM 1 G IJ SOLR
500.0000 mg | Freq: Once | INTRAMUSCULAR | Status: AC
  Administered 2022-10-08: 500 mg via INTRAMUSCULAR
  Filled 2022-10-08: qty 10

## 2022-10-08 MED ORDER — PHENAZOPYRIDINE HCL 100 MG PO TABS: 100.0000 mg | ORAL_TABLET | Freq: Three times a day (TID) | ORAL | 0 refills | Status: AC | PRN

## 2022-10-08 MED ORDER — PHENAZOPYRIDINE HCL 200 MG PO TABS
200.0000 mg | ORAL_TABLET | Freq: Once | ORAL | Status: AC
  Administered 2022-10-08: 200 mg via ORAL
  Filled 2022-10-08: qty 1

## 2022-10-08 MED ORDER — LIDOCAINE HCL (PF) 1 % IJ SOLN
2.1000 mL | Freq: Once | INTRAMUSCULAR | Status: AC
  Administered 2022-10-08: 2.1 mL
  Filled 2022-10-08: qty 5

## 2022-10-08 NOTE — ED Provider Notes (Signed)
[facility] Provider Note    Event Date/Time   First MD Initiated Contact with Patient 10/08/22 1948     (approximate)   History   Hematuria   HPI  Kimberly Carney is a 32 y.o. female with history of kidney stone, HSV-2, PID, kidney stone and as listed in EMR presents to the emergency department for treatment and evaluation of vaginal pain and dysuria.  She is also had hematuria since yesterday.  She is also starting to have some low back pain.  She went to the ER at [facility] last night and had a CT scan, labs, and urinalysis.  She was told that she did not have a kidney stone or urinary tract infection and was discharged home.  Today, hematuria has gotten worse as have all other symptoms.  No fever..      Physical Exam   Triage Vital Signs: ED Triage Vitals  Enc Vitals Group     BP 10/08/22 1855 (!) 126/101     Pulse Rate 10/08/22 1855 92     Resp 10/08/22 1855 16     Temp 10/08/22 1855 97.9 F (36.6 C)     Temp src --      SpO2 10/08/22 1855 97 %     Weight 10/08/22 1856 141 lb 1.5 oz (64 kg)     Height 10/08/22 1856 5\' 4"  (1.626 m)     Head Circumference --      Peak Flow --      Pain Score 10/08/22 1855 8     Pain Loc --      Pain Edu? --      Excl. in GC? --     Most recent vital signs: Vitals:   10/08/22 1855 10/08/22 2218  BP: (!) 126/101 117/86  Pulse: 92 75  Resp: 16 16  Temp: 97.9 F (36.6 C)   SpO2: 97% 96%    General: Awake, no distress.  CV:  Good peripheral perfusion.  Resp:  Normal effort.  Abd:  No distention. No tenderness. Other:  No cervical motion tenderness.   ED Results / Procedures / Treatments   Labs (all labs ordered are listed, but only abnormal results are displayed) Labs Reviewed  URINALYSIS, ROUTINE W REFLEX MICROSCOPIC - Abnormal; Notable for the following components:      Result Value   Color, Urine YELLOW (*)    APPearance CLOUDY (*)    Hgb urine dipstick LARGE (*)    Protein,  ur 100 (*)    Bacteria, UA RARE (*)    All other components within normal limits  WET PREP, GENITAL  CHLAMYDIA/NGC RT PCR ([facility] ONLY)            POC URINE PREG, ED     EKG  Not indicated   RADIOLOGY  Image and radiology report reviewed and interpreted by me. Radiology report consistent with the same.  Not indicated.  PROCEDURES:  Critical Care performed: No  Procedures   MEDICATIONS ORDERED IN ED:  Medications  cefTRIAXone (ROCEPHIN) injection 500 mg (500 mg Intramuscular Given 10/08/22 2139)  lidocaine (PF) (XYLOCAINE) 1 % injection 2.1 mL (2.1 mLs Other Given 10/08/22 2139)  phenazopyridine (PYRIDIUM) tablet 200 mg (200 mg Oral Given 10/08/22 2139)     IMPRESSION / MDM / ASSESSMENT AND PLAN / ED COURSE   I have reviewed the triage note.  Differential diagnosis includes, but is not limited to, kidney stone, UTI, pyelonephritis, STI, PID  Patient's presentation  is most consistent with acute complicated illness / injury requiring diagnostic workup.  32 year old female presenting to the emergency department for treatment and evaluation of vaginal pain and dysuria, and hematuria.  See HPI for further details.  Urinalysis is consistent with acute cystitis.  Patient would also like to be evaluated for STI.  Pelvic exam completed.  Wet prep and chlamydia and gonorrhea is submitted to the lab.  Wet prep was negative.  I have low suspicion for chlamydia or gonorrhea.  For her UTI, she was given Rocephin here and will take Keflex for the next 3 days.  She will check her MyChart account to see the results of the chlamydia and gonorrhea.  If either are positive, she was instructed to call the emergency department for further directions.         FINAL CLINICAL IMPRESSION(S) / ED DIAGNOSES   Final diagnoses:  Acute cystitis with hematuria  Vaginal pain     Rx / DC Orders   ED Discharge Orders          Ordered    phenazopyridine (PYRIDIUM) 100 MG tablet  3 times  daily PRN        10/08/22 2209    cephALEXin (KEFLEX) 500 MG capsule  3 times daily        10/08/22 2209             Note:  This document was prepared using Dragon voice recognition software and may include unintentional dictation errors.   [name], FNP 10/08/22 2320    Merwyn Katos, MD 10/09/22 0000

## 2022-10-08 NOTE — ED Provider Triage Note (Signed)
Emergency Medicine Provider Triage Evaluation Note  Kimberly Carney , a 32 y.o. female  was evaluated in triage.  Pt complains of dysuria for the past few days and hematuria since yesterday. Also having vaginal irritation and pain. No vaginal discharge.   Physical Exam  BP (!) 126/101   Pulse 92   Temp 97.9 F (36.6 C)   Resp 16   Ht 5\' 4"  (1.626 m)   Wt 64 kg   SpO2 97%   BMI 24.22 kg/m  Gen:   Awake, no distress   Resp:  Normal effort  MSK:   Moves extremities without difficulty  Other:    Medical Decision Making  Medically screening exam initiated at 6:59 PM.  Appropriate orders placed.  Kimberly Carney was informed that the remainder of the evaluation will be completed by another provider, this initial triage assessment does not replace that evaluation, and the importance of remaining in the ED until their evaluation is complete.  Labs, UA, and CT for stone performed last night at [facility]. No meds prescribed.   [name], FNP 10/08/22 1901

## 2022-10-08 NOTE — Discharge Instructions (Signed)
Follow up with your primary care provider if not improving over the next few days.  Check your MyChart for the results of your chlamydia and gonorrhea test. If either are positive, call back to the ER.  Return to the ER for symptoms of concern if unable to schedule an appointment.

## 2022-10-08 NOTE — ED Triage Notes (Signed)
Pt to ED for vaginal discomfort, burning with urination, right flank pain, urinary frequency for the past few days.  Was seen at ER last night, neg CT.

## 2022-10-09 ENCOUNTER — Telehealth: Payer: Self-pay | Admitting: Emergency Medicine

## 2022-10-09 ENCOUNTER — Telehealth: Payer: Self-pay

## 2022-10-09 ENCOUNTER — Ambulatory Visit: Payer: Self-pay | Admitting: *Deleted

## 2022-10-09 LAB — WET PREP, GENITAL
Sperm: NONE SEEN
Trich, Wet Prep: NONE SEEN
Yeast Wet Prep HPF POC: NONE SEEN

## 2022-10-09 MED ORDER — METRONIDAZOLE 500 MG PO TABS: 500.0000 mg | ORAL_TABLET | Freq: Two times a day (BID) | ORAL | 0 refills | Status: DC

## 2022-10-09 MED ORDER — METRONIDAZOLE 500 MG PO TABS: 500.0000 mg | ORAL_TABLET | Freq: Two times a day (BID) | ORAL | 0 refills | Status: AC

## 2022-10-09 NOTE — Telephone Encounter (Signed)
Pt calling regarding lab results. Pt wanted to know what "clue" cells were. Pt will call ED to see if medication will be prescribed.

## 2022-10-09 NOTE — Telephone Encounter (Signed)
Corrected lab result on wet prep + BV. Will rx flagyl x 7d

## 2022-10-09 NOTE — Telephone Encounter (Signed)
-----------------------------------------   2:24 PM on 10/09/2022 ----------------------------------------- Lab contacted the ED to notify us that patient's wet prep had come back with clue cells, which was not initially reported.  Given patient's symptoms, we will prescribe course of Flagyl, which was sent to her pharmacy electronically.  Patient was updated on new prescription.

## 2022-10-09 NOTE — Telephone Encounter (Signed)
  Chief Complaint: Dysuria Symptoms: Hematuria, dysuria. Seen in ED yesterday, cystitis. States pain has worsened and "Bleeding more, lots of clots." Back pain. Frequency: Yesterday Pertinent Negatives: Patient denies  Disposition: [x] ED /[] Urgent Care (no appt availability in office) / [] Appointment(In office/virtual)/ []  [facility]/ [] Home Care/ [] Refused Recommended Disposition /[] [facility]/ []  Follow-up with PCP Additional Notes: Has just started ATBs provided in ED. With level of pain and increased bleeding, advised return to ED. Care advise provided, verbalizes understanding.  Reason for Disposition  Passing pure blood or large blood clots (i.e., size > a dime)  (Exception: Fleck or small strands.)    "Lots of clots"  Answer Assessment - Initial Assessment Questions 1. COLOR of URINE: "Describe the color of the urine."  (e.g., tea-colored, pink, red, bloody) "Do you have blood clots in your urine?" (e.g., none, pea, grape, small coin)     Bloody, lots of clots 2. ONSET: "When did the bleeding start?"      Seen in ED yesterday 3. EPISODES: "How many times has there been blood in the urine?" or "How many times today?"     Many 4. PAIN with URINATION: "Is there any pain with passing your urine?" If Yes, ask: "How bad is the pain?"  (Scale 1-10; or mild, moderate, severe)    - MILD: Complains slightly about urination hurting.    - MODERATE: Interferes with normal activities.      - SEVERE: Excruciating, unwilling or unable to urinate because of the pain.      severe 5. FEVER: "Do you have a fever?" If Yes, ask: "What is your temperature, how was it measured, and when did it start?"      6. ASSOCIATED SYMPTOMS: "Are you passing urine more frequently than usual?"     yes 7. OTHER SYMPTOMS: "Do you have any other symptoms?" (e.g., back/flank pain, abdomen pain, vomiting)     Lower back pain, flank pain  Protocols used: Urine - Blood In-A-AH

## 2022-10-10 ENCOUNTER — Other Ambulatory Visit: Payer: Self-pay

## 2022-10-10 ENCOUNTER — Emergency Department
Admission: EM | Admit: 2022-10-10 | Discharge: 2022-10-10 | Disposition: A | Discharge status: Home / Self Care | Payer: 59 | Attending: Emergency Medicine | Admitting: Emergency Medicine

## 2022-10-10 DIAGNOSIS — R350 Frequency of micturition: Secondary | ICD-10-CM | POA: Diagnosis present

## 2022-10-10 DIAGNOSIS — N12 Tubulo-interstitial nephritis, not specified as acute or chronic: Secondary | ICD-10-CM | POA: Insufficient documentation

## 2022-10-10 LAB — COMPREHENSIVE METABOLIC PANEL
ALT: 42 U/L (ref 0–44)
AST: 40 U/L (ref 15–41)
Albumin: 4.9 g/dL (ref 3.5–5.0)
Alkaline Phosphatase: 63 U/L (ref 38–126)
Anion gap: 10 (ref 5–15)
BUN: 11 mg/dL (ref 6–20)
CO2: 19 mmol/L — ABNORMAL LOW (ref 22–32)
Calcium: 9 mg/dL (ref 8.9–10.3)
Chloride: 106 mmol/L (ref 98–111)
Creatinine, Ser: 0.76 mg/dL (ref 0.44–1.00)
GFR, Estimated: >60 mL/min (ref >60)
Glucose, Bld: 116 mg/dL — ABNORMAL HIGH (ref 70–99)
Potassium: 3.9 mmol/L (ref 3.5–5.1)
Sodium: 135 mmol/L (ref 135–145)
Total Bilirubin: 0.9 mg/dL (ref 0.3–1.2)
Total Protein: 7.7 g/dL (ref 6.5–8.1)

## 2022-10-10 LAB — CBC WITH DIFFERENTIAL/PLATELET
Abs Immature Granulocytes: 0.02 10*3/uL (ref 0.00–0.07)
Basophils Absolute: 0.1 10*3/uL (ref 0.0–0.1)
Basophils Relative: 1 %
Eosinophils Absolute: 0.1 10*3/uL (ref 0.0–0.5)
Eosinophils Relative: 1 %
HCT: 38.7 % (ref 36.0–46.0)
Hemoglobin: 13 g/dL (ref 12.0–15.0)
Immature Granulocytes: 0 %
Lymphocytes Relative: 29 %
Lymphs Abs: 2.4 10*3/uL (ref 0.7–4.0)
MCH: 20.8 pg — ABNORMAL LOW (ref 26.0–34.0)
MCHC: 33.6 g/dL (ref 30.0–36.0)
MCV: 61.8 fL — ABNORMAL LOW (ref 80.0–100.0)
Monocytes Absolute: 0.7 10*3/uL (ref 0.1–1.0)
Monocytes Relative: 9 %
Neutro Abs: 5.1 10*3/uL (ref 1.7–7.7)
Neutrophils Relative %: 60 %
Platelets: 357 10*3/uL (ref 150–400)
RBC: 6.26 MIL/uL — ABNORMAL HIGH (ref 3.87–5.11)
RDW: 15.8 % — ABNORMAL HIGH (ref 11.5–15.5)
Smear Review: ADEQUATE
WBC: 8.4 10*3/uL (ref 4.0–10.5)
nRBC: 0 % (ref 0.0–0.2)

## 2022-10-10 LAB — URINALYSIS, ROUTINE W REFLEX MICROSCOPIC
Bacteria, UA: NONE SEEN
RBC / HPF: >50 RBC/hpf (ref 0–5)

## 2022-10-10 MED ORDER — ONDANSETRON HCL 4 MG/2ML IJ SOLN
4.0000 mg | Freq: Once | INTRAMUSCULAR | Status: AC
  Administered 2022-10-10: 4 mg via INTRAVENOUS
  Filled 2022-10-10: qty 2

## 2022-10-10 MED ORDER — SODIUM CHLORIDE 0.9 % IV SOLN
1.0000 g | Freq: Once | INTRAVENOUS | Status: AC
  Administered 2022-10-10: 1 g via INTRAVENOUS
  Filled 2022-10-10: qty 10

## 2022-10-10 MED ORDER — SODIUM CHLORIDE 0.9 % IV BOLUS
1000.0000 mL | Freq: Once | INTRAVENOUS | Status: AC
  Administered 2022-10-10: 1000 mL via INTRAVENOUS

## 2022-10-10 MED ORDER — TAMSULOSIN HCL 0.4 MG PO CAPS: 0.4000 mg | ORAL_CAPSULE | Freq: Every day | ORAL | 0 refills | Status: AC

## 2022-10-10 MED ORDER — ONDANSETRON 4 MG PO TBDP: 4.0000 mg | ORAL_TABLET | Freq: Three times a day (TID) | ORAL | 0 refills | Status: DC | PRN

## 2022-10-10 MED ORDER — CEPHALEXIN 500 MG PO CAPS: 500.0000 mg | ORAL_CAPSULE | Freq: Three times a day (TID) | ORAL | 0 refills | Status: AC

## 2022-10-10 MED ORDER — FENTANYL CITRATE PF 50 MCG/ML IJ SOSY
50.0000 ug | PREFILLED_SYRINGE | Freq: Once | INTRAMUSCULAR | Status: AC
  Administered 2022-10-10: 50 ug via INTRAVENOUS
  Filled 2022-10-10: qty 1

## 2022-10-10 MED ORDER — KETOROLAC TROMETHAMINE 30 MG/ML IJ SOLN
15.0000 mg | Freq: Once | INTRAMUSCULAR | Status: AC
  Administered 2022-10-10: 15 mg via INTRAVENOUS
  Filled 2022-10-10: qty 1

## 2022-10-10 MED ORDER — HYDROCODONE-ACETAMINOPHEN 5-325 MG PO TABS: 1.0000 | ORAL_TABLET | Freq: Three times a day (TID) | ORAL | 0 refills | Status: AC | PRN

## 2022-10-10 NOTE — ED Notes (Signed)
Last tylenol at 0200. No ibuprofen taken.

## 2022-10-10 NOTE — ED Triage Notes (Signed)
Pt presents to ED with c/o of dysuria and increased urine frequency. Pt given ABX this past Wed but did not pick of RX of until yesterday, pt has taken 1 full daily dose of ABX.

## 2022-10-10 NOTE — ED Notes (Signed)
IVF infusing, ABX hung, pt up to b/r with urgency, steady gait.

## 2022-10-10 NOTE — ED Provider Notes (Signed)
[facility] Provider Note    Event Date/Time   First MD Initiated Contact with Patient 10/10/22 [phone]     (approximate)   History   Urinary Frequency   HPI  Kimberly Carney is a 32 y.o. female  with history of kidney stone, pyelonephritis, PID, and as listed in EMR presents to the emergency department for hematuria and right flank pain. Here Wednesday and treated with Rocephin and sent home with Keflex and pyridium prescriptions. She took 1 dose of Keflex yesterday. Pain has intensified and she is now nauseated with diarrhea. No suspected fever.      Physical Exam   Triage Vital Signs: ED Triage Vitals  Enc Vitals Group     BP 10/10/22 0801 (!) 127/92     Pulse Rate 10/10/22 0801 (!) 109     Resp 10/10/22 0801 18     Temp 10/10/22 0801 98.3 F (36.8 C)     Temp Source 10/10/22 0801 Oral     SpO2 10/10/22 0801 97 %     Weight --      Height --      Head Circumference --      Peak Flow --      Pain Score 10/10/22 0805 8     Pain Loc --      Pain Edu? --      Excl. in GC? --     Most recent vital signs: Vitals:   10/10/22 0856 10/10/22 0946  BP:  114/88  Pulse:  74  Resp:  18  Temp: 98 F (36.7 C)   SpO2:  100%    General: Awake, no distress.  CV:  Good peripheral perfusion.  Resp:  Normal effort.  Abd:  No distention.  Other:  Right CVA tenderness.   ED Results / Procedures / Treatments   Labs (all labs ordered are listed, but only abnormal results are displayed) Labs Reviewed  URINALYSIS, ROUTINE W REFLEX MICROSCOPIC - Abnormal; Notable for the following components:      Result Value   Color, Urine RED (*)    APPearance CLOUDY (*)    Glucose, UA   (*)    Value: TEST NOT REPORTED DUE TO COLOR INTERFERENCE OF URINE PIGMENT   Hgb urine dipstick   (*)    Value: TEST NOT REPORTED DUE TO COLOR INTERFERENCE OF URINE PIGMENT   Bilirubin Urine   (*)    Value: TEST NOT REPORTED DUE TO COLOR INTERFERENCE OF URINE PIGMENT    Ketones, ur   (*)    Value: TEST NOT REPORTED DUE TO COLOR INTERFERENCE OF URINE PIGMENT   Protein, ur   (*)    Value: TEST NOT REPORTED DUE TO COLOR INTERFERENCE OF URINE PIGMENT   Nitrite   (*)    Value: TEST NOT REPORTED DUE TO COLOR INTERFERENCE OF URINE PIGMENT   Leukocytes,Ua   (*)    Value: TEST NOT REPORTED DUE TO COLOR INTERFERENCE OF URINE PIGMENT   All other components within normal limits  COMPREHENSIVE METABOLIC PANEL - Abnormal; Notable for the following components:   CO2 19 (*)    Glucose, Bld 116 (*)    All other components within normal limits  CBC WITH DIFFERENTIAL/PLATELET - Abnormal; Notable for the following components:   RBC 6.26 (*)    MCV 61.8 (*)    MCH 20.8 (*)    RDW 15.8 (*)    All other components within normal limits  URINE CULTURE  EKG     RADIOLOGY  Image and radiology report reviewed and interpreted by me. Radiology report consistent with the same.  Not indicated  PROCEDURES:  Critical Care performed: No  Procedures   MEDICATIONS ORDERED IN ED:  Medications  sodium chloride 0.9 % bolus 1,000 mL (0 mLs Intravenous Stopped 10/10/22 0942)  fentaNYL (SUBLIMAZE) injection 50 mcg (50 mcg Intravenous Given 10/10/22 0832)  ondansetron (ZOFRAN) injection 4 mg (4 mg Intravenous Given 10/10/22 0831)  cefTRIAXone (ROCEPHIN) 1 g in sodium chloride 0.9 % 100 mL IVPB (0 g Intravenous Stopped 10/10/22 0942)  ketorolac (TORADOL) 30 MG/ML injection 15 mg (15 mg Intravenous Given 10/10/22 0950)     IMPRESSION / MDM / ASSESSMENT AND PLAN / ED COURSE   I have reviewed the triage note.  Differential diagnosis includes, but is not limited to, pyelonephritis, acute cystitis, kidney stone  Patient's presentation is most consistent with acute presentation with potential threat to life or bodily function.  32 year old female presents to the ER for evaluation of dysuria, hematuria, and right flank pain worse since here on Wednesday.  She states that she  feels like she is having to strain to urinate.  Plan will be to review last results, give fluids, antibiotics, pain and nausea medications. Repeat labs ordered as well as UA.  Vital signs reviewed and are normal.  Patient also states that she called back yesterday when she saw that the test for clue cells was positive.  Someone called in a prescription for Flagyl.  She did take start that last night.  CBC is overall reassuring.  White count is normal.  CMP also unremarkable.  Kidney function is normal.  Urinalysis shows some white blood cells but no bacteria.  The remainder of the test unable to be performed due to the amount of blood in the specimen.  Patient is not currently on her menstrual cycle.  Urine culture added.  Patient's pain improved after Toradol.  Test results reviewed with the patient.  She remains afebrile and not tachycardic. Admission considered, however she has only had one dose of the Keflex prescribed on Wednesday. She feels comfortable with going home. ER return precautions discussed. Plan will be to extend the number of days of Keflex and have her continue the Flagyl.  I will also prescribe Flomax  and Zofran and a short course of pain medication. She will be encouraged to follow up with her PCP or return here if symptoms worsen.      FINAL CLINICAL IMPRESSION(S) / ED DIAGNOSES   Final diagnoses:  Pyelonephritis     Rx / DC Orders   ED Discharge Orders          Ordered    cephALEXin (KEFLEX) 500 MG capsule  3 times daily        10/10/22 1111    tamsulosin (FLOMAX) 0.4 MG CAPS capsule  Daily        10/10/22 1111    ondansetron (ZOFRAN-ODT) 4 MG disintegrating tablet  Every 8 hours PRN        10/10/22 1111    HYDROcodone-acetaminophen (NORCO/VICODIN) 5-325 MG tablet  Every 8 hours PRN        10/10/22 1111             Note:  This document was prepared using Dragon voice recognition software and may include unintentional dictation errors.   [name], FNP 10/10/22 1126    [name], MD 10/11/22 [phone]

## 2022-10-10 NOTE — Discharge Instructions (Addendum)
If you develop a fever or the pain worsens, return to the ER.  Continue taking the keflex and the flagyl. I have extended the number of days to take the keflex, which will be a total of 7 days.  If you take the pain medication, do not drive do anything dangerous.  Follow up with primary care or urgent care for recheck of your urine in about 10 days to make sure everything is clear.

## 2022-10-11 LAB — URINE CULTURE: Culture: NO GROWTH

## 2022-10-17 ENCOUNTER — Ambulatory Visit: Payer: BC Managed Care – PPO

## 2023-01-12 ENCOUNTER — Ambulatory Visit: Payer: BC Managed Care – PPO

## 2023-01-12 ENCOUNTER — Encounter: Payer: Self-pay | Admitting: Family Medicine

## 2023-01-12 DIAGNOSIS — B9689 Other specified bacterial agents as the cause of diseases classified elsewhere: Secondary | ICD-10-CM

## 2023-01-12 DIAGNOSIS — Z113 Encounter for screening for infections with a predominantly sexual mode of transmission: Secondary | ICD-10-CM

## 2023-01-12 DIAGNOSIS — N760 Acute vaginitis: Secondary | ICD-10-CM

## 2023-01-12 LAB — WET PREP FOR TRICH, YEAST, CLUE
Trichomonas Exam: NEGATIVE
Yeast Exam: NEGATIVE

## 2023-01-12 LAB — HM HIV SCREENING LAB: HM HIV Screening: NEGATIVE

## 2023-01-12 MED ORDER — METRONIDAZOLE 500 MG PO TABS
500.0000 mg | ORAL_TABLET | Freq: Two times a day (BID) | ORAL | Status: AC
Start: 2023-01-12 — End: 2023-01-19

## 2023-01-12 NOTE — Progress Notes (Signed)
[facility] Department  STI clinic/screening visit [address] [place] [address] [phone]  Subjective:  Kimberly Carney is a 32 y.o. female being seen today for an STI screening visit. The patient reports they do have symptoms.  Patient reports that they do not desire a pregnancy in the next year.   They reported they are not interested in discussing contraception today.    Patient's last menstrual period was 11/22/2022 (exact date).  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 Reports a 1 week hx of itching, discharge and stinging.   Does the patient using douching products? No  Last HIV test per patient/review of record was  Lab Results  Component Value Date   HMHIVSCREEN Negative - Validated 06/02/2022   No results found for: "HIV" Patient reports last pap was No results found for: "DIAGPAP"  Lab Results  Component Value Date   SPECADGYN Comment 01/21/2021    Screening for MPX risk: Does the patient have an unexplained rash? No Is the patient MSM? No Does the patient endorse multiple sex partners or anonymous sex partners? No Did the patient have close or sexual contact with a person diagnosed with MPX? No Has the patient traveled outside the [place] where MPX is endemic? No Is there a high clinical suspicion for MPX-- evidenced by one of the  following No  -Unlikely to be chickenpox  -Lymphadenopathy  -Rash that present in same phase of evolution on any given body part See flowsheet for further details and programmatic requirements.   Immunization history:   There is no immunization history on file for this patient.   The following portions of the patient's history were reviewed and updated as appropriate: allergies, current medications, past medical history, past social history, past surgical history and problem list.  Objective:  There were no vitals filed for this visit.  Physical Exam Vitals and nursing note reviewed. Exam conducted with a chaperone present Marylu Lund Idol FNP).  Constitutional:      Appearance: Normal appearance.  HENT:     Head: Normocephalic and atraumatic.     Mouth/Throat:     Mouth: Mucous membranes are moist.     Pharynx: Oropharynx is clear. No oropharyngeal exudate or posterior oropharyngeal erythema.  Pulmonary:     Effort: Pulmonary effort is normal.  Abdominal:     General: Abdomen is flat.     Palpations: There is no mass.     Tenderness: There is no abdominal tenderness. There is no rebound.  Genitourinary:    General: Normal vulva.     Exam position: Lithotomy position.     Pubic Area: No rash or pubic lice.      Labia:        Right: No rash or lesion.        Left: No rash or lesion.  Vagina: Vaginal discharge present. No erythema, bleeding or lesions.     Cervix: No cervical motion tenderness, discharge, friability, lesion or erythema.     Uterus: Normal.      Adnexa: Right adnexa normal and left adnexa normal.     Rectum: Normal.     Comments: pH = 5  White discharge present Lymphadenopathy:     Head:     Right side of head: No preauricular or posterior auricular adenopathy.     Left side of head: No preauricular or posterior auricular adenopathy.     Cervical: No cervical adenopathy.     Upper Body:     Right upper body: No supraclavicular, axillary or epitrochlear  adenopathy.     Left upper body: No supraclavicular, axillary or epitrochlear adenopathy.     Lower Body: No right inguinal adenopathy. No left inguinal adenopathy.  Skin:    General: Skin is warm and dry.     Findings: No rash.  Neurological:     Mental Status: She is alert and oriented to person, place, and time.      Assessment and Plan:  Kimberly Carney is a 32 y.o. female presenting to the [facility] Department for STI screening  1. Screening for venereal disease  - Gonococcus culture - Chlamydia/Gonorrhea [place] [facility] - HIV [place] [facility] - Syphilis Serology, [place] [facility] - WET PREP FOR TRICH, YEAST, CLUE  2. Bacterial vaginosis  - metroNIDAZOLE (FLAGYL) 500 MG tablet; Take 1 tablet (500 mg total) by mouth 2 (two) times daily for 7 days.   Patient accepted all screenings including oral, vaginal CT/GC and bloodwork for HIV/RPR, and wet prep. Patient meets criteria for HepB screening? No. Ordered? not applicable Patient meets criteria for HepC screening? No. Ordered? not applicable  Treat wet prep per standing order Discussed time line for [facility] results and that patient will be called with positive results and encouraged patient to call if she had not heard in 2 weeks.  Counseled to return or seek care for continued or worsening symptoms Recommended repeat testing in 3 months with positive results. Recommended condom use with all sex  Patient is currently using  nothing  to prevent pregnancy.    No follow-ups on file.  No future appointments. Total time spent 20 minutes  Lenice Llamas, [place]

## 2023-01-12 NOTE — Progress Notes (Signed)
PT is here for std screening.  Wet prep results reviewed and treated per standing order. The patient was dispensed metronidazole today. I provided counseling today regarding the medication. We discussed the medication, the side effects and when to call clinic. Patient given the opportunity to ask questions. Questions answered.  Condoms declined. Gaspar Garbe, RN

## 2023-01-17 LAB — GONOCOCCUS CULTURE

## 2023-02-04 ENCOUNTER — Other Ambulatory Visit: Payer: Self-pay | Admitting: Family Medicine

## 2023-02-04 DIAGNOSIS — B009 Herpesviral infection, unspecified: Secondary | ICD-10-CM

## 2023-02-16 ENCOUNTER — Other Ambulatory Visit: Payer: Self-pay | Admitting: Family Medicine

## 2023-02-16 ENCOUNTER — Telehealth: Payer: Self-pay | Admitting: Family Medicine

## 2023-02-16 NOTE — Telephone Encounter (Signed)
Pt calling because the pharmacy has not received the prescription to refill her medication. Please call her back and/or call the [facility] ([address] [place], [place]). She may change pharmacy if needed.

## 2023-06-09 ENCOUNTER — Emergency Department
Admission: EM | Admit: 2023-06-09 | Discharge: 2023-06-09 | Disposition: A | Discharge status: Home / Self Care | Payer: BC Managed Care – PPO | Attending: Emergency Medicine | Admitting: Emergency Medicine

## 2023-06-09 ENCOUNTER — Other Ambulatory Visit: Payer: Self-pay

## 2023-06-09 DIAGNOSIS — R112 Nausea with vomiting, unspecified: Secondary | ICD-10-CM | POA: Diagnosis present

## 2023-06-09 DIAGNOSIS — N760 Acute vaginitis: Secondary | ICD-10-CM | POA: Diagnosis not present

## 2023-06-09 DIAGNOSIS — B9689 Other specified bacterial agents as the cause of diseases classified elsewhere: Secondary | ICD-10-CM | POA: Diagnosis not present

## 2023-06-09 LAB — URINALYSIS, W/ REFLEX TO CULTURE (INFECTION SUSPECTED)
Bilirubin Urine: NEGATIVE
Glucose, UA: NEGATIVE mg/dL
Hgb urine dipstick: NEGATIVE
Ketones, ur: NEGATIVE mg/dL
Nitrite: NEGATIVE
Protein, ur: NEGATIVE mg/dL
Specific Gravity, Urine: 1.021 (ref 1.005–1.030)
pH: 5 (ref 5.0–8.0)

## 2023-06-09 LAB — RESP PANEL BY RT-PCR (RSV, FLU A&B, COVID)  RVPGX2
Influenza A by PCR: NEGATIVE
Influenza B by PCR: NEGATIVE
Resp Syncytial Virus by PCR: NEGATIVE
SARS Coronavirus 2 by RT PCR: NEGATIVE

## 2023-06-09 LAB — WET PREP, GENITAL
Sperm: NONE SEEN
Trich, Wet Prep: NONE SEEN
WBC, Wet Prep HPF POC: >=10 — AB (ref <10)
Yeast Wet Prep HPF POC: NONE SEEN

## 2023-06-09 LAB — CHLAMYDIA/NGC RT PCR (ARMC ONLY)
Chlamydia Tr: NOT DETECTED
N gonorrhoeae: NOT DETECTED

## 2023-06-09 LAB — POC URINE PREG, ED: Preg Test, Ur: NEGATIVE

## 2023-06-09 LAB — CBG MONITORING, ED: Glucose-Capillary: 115 mg/dL — ABNORMAL HIGH (ref 70–99)

## 2023-06-09 MED ORDER — ONDANSETRON 4 MG PO TBDP
4.0000 mg | ORAL_TABLET | Freq: Once | ORAL | Status: AC
  Administered 2023-06-09: 4 mg via ORAL
  Filled 2023-06-09: qty 1

## 2023-06-09 MED ORDER — ONDANSETRON 4 MG PO TBDP
4.0000 mg | ORAL_TABLET | Freq: Three times a day (TID) | ORAL | 0 refills | Status: AC | PRN
Start: 2023-06-09 — End: ?

## 2023-06-09 MED ORDER — METRONIDAZOLE 500 MG PO TABS: 500.0000 mg | ORAL_TABLET | Freq: Two times a day (BID) | ORAL | 0 refills | Status: AC

## 2023-06-09 MED ORDER — KETOROLAC TROMETHAMINE 30 MG/ML IJ SOLN
30.0000 mg | Freq: Once | INTRAMUSCULAR | Status: AC
  Administered 2023-06-09: 30 mg via INTRAMUSCULAR
  Filled 2023-06-09: qty 1

## 2023-06-09 NOTE — ED Notes (Signed)
 See triage note   Presents with some nausea and body aches   Sx's started couple of days ago  Afebrile on arrival

## 2023-06-09 NOTE — ED Triage Notes (Signed)
 Patient reports dizzy, nausea and body aches since yesterday; denies known sick contacts.

## 2023-06-09 NOTE — ED Provider Notes (Signed)
 [facility] Provider Note    Event Date/Time   First MD Initiated Contact with Patient 06/09/23 1621     (approximate)   History   Nausea   HPI  Kimberly Carney is a 33 y.o. female no significant past medical history presents to the emergency department for not feeling well.  Endorses 3 to 4 days of not feeling well with body aches and pains with nausea and vomiting.  States that she has been having urinary urgency and frequency with vaginal discharge.  Denies history of STI.  Does not believe that she is pregnant.  Denies any significant abdominal pain.  Denies any back pain.     Physical Exam   Triage Vital Signs: ED Triage Vitals  Encounter Vitals Group     BP 06/09/23 1317 107/80     Systolic BP Percentile --      Diastolic BP Percentile --      Pulse Rate 06/09/23 1317 (!) 110     Resp 06/09/23 1317 18     Temp 06/09/23 1317 98.1 F (36.7 C)     Temp Source 06/09/23 1317 Oral     SpO2 06/09/23 1317 99 %     Weight 06/09/23 1317 136 lb (61.7 kg)     Height 06/09/23 1317 5\' 4"  (1.626 m)     Head Circumference --      Peak Flow --      Pain Score 06/09/23 1318 7     Pain Loc --      Pain Education --      Exclude from Growth Chart --     Most recent vital signs: Vitals:   06/09/23 1933 06/09/23 2054  BP: 106/79 110/82  Pulse: 96 90  Resp: 18 18  Temp:    SpO2: 100% 99%    Physical Exam Constitutional:      Appearance: She is well-developed.  HENT:     Head: Atraumatic.     Mouth/Throat:     Mouth: Mucous membranes are moist.  Eyes:     Conjunctiva/sclera: Conjunctivae normal.     Pupils: Pupils are equal, round, and reactive to light.  Cardiovascular:     Rate and Rhythm: Regular rhythm.  Pulmonary:     Effort: No respiratory distress.  Abdominal:     General: There is no distension.     Tenderness: There is no abdominal tenderness.  Musculoskeletal:        General: Normal range of motion.     Cervical back:  Normal range of motion.  Skin:    General: Skin is warm.     Capillary Refill: Capillary refill takes less than 2 seconds.  Neurological:     Mental Status: She is alert. Mental status is at baseline.  Psychiatric:        Mood and Affect: Mood normal.     IMPRESSION / MDM / ASSESSMENT AND PLAN / ED COURSE  I reviewed the triage vital signs and the nursing notes.  Differential diagnosis including pregnancy, urinary tract infection, bacterial vaginosis, GC/chlamydia, new onset diabetes, ectopic pregnancy, viral illness including COVID/influenza    LABS (all labs ordered are listed, but only abnormal results are displayed) Labs interpreted as -    Labs Reviewed  WET PREP, GENITAL - Abnormal; Notable for the following components:      Result Value   Clue Cells Wet Prep HPF POC PRESENT (*)    WBC, Wet Prep HPF POC >=10 (*)  All other components within normal limits  URINALYSIS, W/ REFLEX TO CULTURE (INFECTION SUSPECTED) - Abnormal; Notable for the following components:   Color, Urine YELLOW (*)    APPearance HAZY (*)    Leukocytes,Ua TRACE (*)    Bacteria, UA RARE (*)    All other components within normal limits  CBG MONITORING, ED - Abnormal; Notable for the following components:   Glucose-Capillary 115 (*)    All other components within normal limits  RESP PANEL BY RT-PCR (RSV, FLU A&B, COVID)  RVPGX2  CHLAMYDIA/NGC RT PCR ([facility] ONLY)            URINE CULTURE  POC URINE PREG, ED     MDM    Patient was given p.o. Zofran and IM ketorolac  Patient stated that she wanted to self swab.  Have a low suspicion for PID or TOA.  Patient self swabbed and was negative for gonorrhea and chlamydia.  Did have WBCs on the wet prep and clue cells.  Does have a history of bacterial vaginosis and states that this feels similar.  Urine without an obvious finding of a urinary tract infection.  Urine was sent for culture but do not feel that starting her on antibiotics for UTI is necessary  at this time.  On reevaluation states that she is feeling much better.  Tolerating p.o.  Will start the patient on antibiotics to cover for bacterial vaginosis and given antiemetics.  Given return precautions for any worsening symptoms or ongoing symptoms and discussed close follow-up with her primary care provider.   PROCEDURES:  Critical Care performed: No  Procedures  Patient's presentation is most consistent with acute complicated illness / injury requiring diagnostic workup.   MEDICATIONS ORDERED IN ED: Medications  ondansetron (ZOFRAN-ODT) disintegrating tablet 4 mg (4 mg Oral Given 06/09/23 1710)  ketorolac (TORADOL) 30 MG/ML injection 30 mg (30 mg Intramuscular Given 06/09/23 2030)    FINAL CLINICAL IMPRESSION(S) / ED DIAGNOSES   Final diagnoses:  Bacterial vaginosis  Nausea and vomiting, unspecified vomiting type     Rx / DC Orders   ED Discharge Orders          Ordered    ondansetron (ZOFRAN-ODT) 4 MG disintegrating tablet  Every 8 hours PRN        06/09/23 2038    metroNIDAZOLE (FLAGYL) 500 MG tablet  2 times daily        06/09/23 2038             Note:  This document was prepared using Dragon voice recognition software and may include unintentional dictation errors.   Corena Herter, MD 06/10/23 [phone]

## 2023-06-09 NOTE — Discharge Instructions (Signed)
 You were seen in the emergency department for not feeling well with bodyaches, nausea vomiting and vaginal discharge.  Your urine did not show an obvious sign of an infection and was sent for culture.  You had clue cells and findings concerning for bacterial vaginosis.  You are given a prescription for antibiotic, take as prescribed.  Do not drink any alcohol while taking this antibiotic.  You are given a prescription for nausea medication.  Follow-up closely with your gynecologist.  Return for any ongoing or worsening symptoms.  Pain control:  Ibuprofen (motrin/aleve/advil) - You can take 3 tablets (600 mg) every 6 hours as needed for pain/fever.  Acetaminophen (tylenol) - You can take 2 extra strength tablets (1000 mg) every 6 hours as needed for pain/fever.  You can alternate these medications or take them together.  Make sure you eat food/drink water when taking these medications.  Thank you for choosing [place] for your health care, it was my pleasure to care for you today!  Corena Herter, MD

## 2023-06-11 LAB — URINE CULTURE

## 2023-06-20 ENCOUNTER — Emergency Department
Admission: EM | Admit: 2023-06-20 | Discharge: 2023-06-20 | Disposition: A | Discharge status: Home / Self Care | Attending: Emergency Medicine | Admitting: Emergency Medicine

## 2023-06-20 DIAGNOSIS — D72829 Elevated white blood cell count, unspecified: Secondary | ICD-10-CM | POA: Insufficient documentation

## 2023-06-20 DIAGNOSIS — T782XXA Anaphylactic shock, unspecified, initial encounter: Secondary | ICD-10-CM | POA: Diagnosis present

## 2023-06-20 LAB — BASIC METABOLIC PANEL
Anion gap: 7 (ref 5–15)
BUN: 15 mg/dL (ref 6–20)
CO2: 26 mmol/L (ref 22–32)
Calcium: 9.1 mg/dL (ref 8.9–10.3)
Chloride: 103 mmol/L (ref 98–111)
Creatinine, Ser: 0.53 mg/dL (ref 0.44–1.00)
GFR, Estimated: >60 mL/min (ref >60)
Glucose, Bld: 76 mg/dL (ref 70–99)
Potassium: 3.6 mmol/L (ref 3.5–5.1)
Sodium: 136 mmol/L (ref 135–145)

## 2023-06-20 LAB — CBC
HCT: 34.9 % — ABNORMAL LOW (ref 36.0–46.0)
Hemoglobin: 11.7 g/dL — ABNORMAL LOW (ref 12.0–15.0)
MCH: 20.3 pg — ABNORMAL LOW (ref 26.0–34.0)
MCHC: 33.5 g/dL (ref 30.0–36.0)
MCV: 60.7 fL — ABNORMAL LOW (ref 80.0–100.0)
Platelets: 373 10*3/uL (ref 150–400)
RBC: 5.75 MIL/uL — ABNORMAL HIGH (ref 3.87–5.11)
RDW: 15.4 % (ref 11.5–15.5)
WBC: 10.7 10*3/uL — ABNORMAL HIGH (ref 4.0–10.5)
nRBC: 0 % (ref 0.0–0.2)

## 2023-06-20 MED ORDER — PREDNISONE 20 MG PO TABS: 40.0000 mg | ORAL_TABLET | Freq: Every day | ORAL | 0 refills | Status: AC

## 2023-06-20 MED ORDER — FAMOTIDINE IN NACL 20-0.9 MG/50ML-% IV SOLN
20.0000 mg | Freq: Once | INTRAVENOUS | Status: AC
  Administered 2023-06-20: 20 mg via INTRAVENOUS
  Filled 2023-06-20: qty 50

## 2023-06-20 MED ORDER — METHYLPREDNISOLONE SODIUM SUCC 125 MG IJ SOLR
125.0000 mg | Freq: Once | INTRAMUSCULAR | Status: AC
  Administered 2023-06-20: 125 mg via INTRAVENOUS
  Filled 2023-06-20: qty 2

## 2023-06-20 MED ORDER — EPINEPHRINE 0.3 MG/0.3ML IJ SOAJ
0.3000 mg | Freq: Once | INTRAMUSCULAR | Status: AC
  Administered 2023-06-20: 0.3 mg via INTRAMUSCULAR
  Filled 2023-06-20: qty 0.3

## 2023-06-20 MED ORDER — EPINEPHRINE 0.3 MG/0.3ML IJ SOAJ: 0.3000 mg | INTRAMUSCULAR | 1 refills | Status: AC | PRN

## 2023-06-20 MED ORDER — DIPHENHYDRAMINE HCL 25 MG PO CAPS
25.0000 mg | ORAL_CAPSULE | Freq: Once | ORAL | Status: AC
  Administered 2023-06-20: 25 mg via ORAL
  Filled 2023-06-20: qty 1

## 2023-06-20 NOTE — Discharge Instructions (Addendum)
 Take allegra or zrytec daily  Take benadryl to help with itching/rash Steroids to help with allergic reaction EPI pen for FUTURE reaction if you can't breath you can administer and return to ER.   You should call the ENT doctor to make a follow-up appointment to try to get allergy testing.  Try to keep a journal of different foods or things that you were doing before the reactions

## 2023-06-20 NOTE — ED Triage Notes (Addendum)
 Pt from home via POV.  Pt reports allergic reaction to unknown substance.  RN notes raised red rash on face, neck, chest, back, arms, and abdomen.  Pt reports stinging pain and itching.  Pt states "it feels like something is in my throat"  Airway is patent no obvious swelling but minor erythema noted.  Minor swelling noted on upper lip.   Pt states her lip was swollen and minor itching yesterday and self administered benadryl.  Took a benadryl this AM, and symptoms progressed to current state at 1800.

## 2023-06-20 NOTE — ED Provider Notes (Signed)
 [facility] Provider Note    Event Date/Time   First MD Initiated Contact with Patient 06/20/23 2033     (approximate)   History   Allergic Reaction   HPI  Kimberly Carney is a 33 y.o. female otherwise healthy comes in with concerns for allergic reaction.  Patient unclear what she is reacting to.  Reports that yesterday she had a little bit of swelling in her lips she took some Benadryl at nighttime and when she woke up this morning her symptoms had resolved.  However when she was at work around 5 PM she developed a rash she tried to rub hydrocortisone cream on it which seemed to help at first but then got worse.  She then developed a change in her voice and a lump sensation inside of her throat which is why she decided to come into the emergency room otherwise she was just taking more Benadryl.  She denies ever having this happen before.  No new foods, detergents, lotions.   Physical Exam   Triage Vital Signs: ED Triage Vitals  Encounter Vitals Group     BP 06/20/23 2008 128/86     Systolic BP Percentile --      Diastolic BP Percentile --      Pulse Rate 06/20/23 2008 90     Resp 06/20/23 2008 20     Temp 06/20/23 2008 97.9 F (36.6 C)     Temp Source 06/20/23 2008 Oral     SpO2 06/20/23 2008 100 %     Weight 06/20/23 2009 130 lb (59 kg)     Height 06/20/23 2009 5\' 4"  (1.626 m)     Head Circumference --      Peak Flow --      Pain Score 06/20/23 2009 8     Pain Loc --      Pain Education --      Exclude from Growth Chart --     Most recent vital signs: Vitals:   06/20/23 2008  BP: 128/86  Pulse: 90  Resp: 20  Temp: 97.9 F (36.6 C)  SpO2: 100%     General: Awake, no distress.  CV:  Good peripheral perfusion.  Resp:  Normal effort.  Abd:  No distention.  Other:  Patient has diffuse rash noted on her neck, upper chest wall. Uticaria like rash.  No obvious swelling to her lips or oropharynx.  Patient however reports feeling a  lump in her throat   ED Results / Procedures / Treatments   Labs (all labs ordered are listed, but only abnormal results are displayed) Labs Reviewed  CBC - Abnormal; Notable for the following components:      Result Value   WBC 10.7 (*)    RBC 5.75 (*)    Hemoglobin 11.7 (*)    HCT 34.9 (*)    MCV 60.7 (*)    MCH 20.3 (*)    All other components within normal limits  BASIC METABOLIC PANEL  POC URINE PREG, ED    PROCEDURES:  Critical Care performed: Yes, see critical care procedure note(s)  .Critical Care  Performed by: Concha Se, MD Authorized by: Concha Se, MD   Critical care provider statement:    Critical care time (minutes):  30   Critical care was necessary to treat or prevent imminent or life-threatening deterioration of the following conditions: anaphylasix.   Critical care was time spent personally by me on the following activities:  Development  of treatment plan with patient or surrogate, discussions with consultants, evaluation of patient's response to treatment, examination of patient, ordering and review of laboratory studies, ordering and review of radiographic studies, ordering and performing treatments and interventions, pulse oximetry, re-evaluation of patient's condition and review of old charts .1-3 Lead EKG Interpretation  Performed by: Concha Se, MD Authorized by: Concha Se, MD     Interpretation: normal     ECG rate:  90   ECG rate assessment: normal     Rhythm: sinus rhythm     Ectopy: none     Conduction: normal      MEDICATIONS ORDERED IN ED: Medications  famotidine (PEPCID) IVPB 20 mg premix (20 mg Intravenous New Bag/Given 06/20/23 2059)  methylPREDNISolone sodium succinate (SOLU-MEDROL) 125 mg/2 mL injection 125 mg (125 mg Intravenous Given 06/20/23 2023)  diphenhydrAMINE (BENADRYL) capsule 25 mg (25 mg Oral Given 06/20/23 2022)  EPINEPHrine (EPI-PEN) injection 0.3 mg (0.3 mg Intramuscular Given 06/20/23 2100)     IMPRESSION /  MDM / ASSESSMENT AND PLAN / ED COURSE  I reviewed the triage vital signs and the nursing notes.   Patient's presentation is most consistent with acute presentation with potential threat to life or bodily function.   Patient comes in with concerns for allergic reaction.  Patient's exam is consistent with Uticaria-rash not consistent with SJS, TEN or other acute pathology.  She reports some changes in her voice earlier although that seems to be getting better but still feels a lump in her throat.  I do not see any obvious signs of anaphylaxis but given her report of this lump in her throat we discussed pros and cons of epinephrine and she would like to proceed with epinephrine.  CBC shows slightly elevated white count, hemoglobin slightly low but similar to priors.  BMP normal patient reports that her last period was a week ago   11:26 PM patient has been monitored for 2 and half hours post epi.  Patient has resolution of symptoms and is requesting to be discharged home.  Patient did not give pregnancy test stating that she just had a period a week ago and just urinated.  No concerns for pregnancy at this time.  The patient is on the cardiac monitor to evaluate for evidence of arrhythmia and/or significant heart rate changes.      FINAL CLINICAL IMPRESSION(S) / ED DIAGNOSES   Final diagnoses:  Anaphylaxis, initial encounter     Rx / DC Orders   ED Discharge Orders          Ordered    EPINEPHrine 0.3 mg/0.3 mL IJ SOAJ injection  As needed        06/20/23 2111    predniSONE (DELTASONE) 20 MG tablet  Daily with breakfast        06/20/23 2111             Note:  This document was prepared using Dragon voice recognition software and may include unintentional dictation errors.   Concha Se, MD 06/20/23 [phone]

## 2023-07-08 ENCOUNTER — Ambulatory Visit

## 2023-07-08 ENCOUNTER — Ambulatory Visit
Admission: RE | Admit: 2023-07-08 | Discharge: 2023-07-08 | Disposition: A | Discharge status: Home / Self Care | Source: Ambulatory Visit | Attending: Family Medicine | Admitting: Family Medicine

## 2023-07-08 VITALS — BP 116/76 | HR 80 | Temp 99.1°F | Resp 16 | Ht 64.0 in | Wt 130.0 lb

## 2023-07-08 DIAGNOSIS — Z113 Encounter for screening for infections with a predominantly sexual mode of transmission: Secondary | ICD-10-CM | POA: Diagnosis present

## 2023-07-08 DIAGNOSIS — N760 Acute vaginitis: Secondary | ICD-10-CM | POA: Diagnosis not present

## 2023-07-08 LAB — HIV ANTIBODY (ROUTINE TESTING W REFLEX): HIV Screen 4th Generation wRfx: NONREACTIVE

## 2023-07-08 MED ORDER — FLUCONAZOLE 150 MG PO TABS: 150.0000 mg | ORAL_TABLET | Freq: Every day | ORAL | 0 refills | Status: AC

## 2023-07-08 NOTE — ED Triage Notes (Signed)
 Pt c/o vaginal itching x1 wk & discharge x2 days. Concerned about STD.

## 2023-07-08 NOTE — Discharge Instructions (Signed)
 Start Diflucan as prescribed.  The clinical contact you with results of the testing done today if positive.  Follow-up with your PCP or gynecologist if symptoms do not improve.  Please go to the ER for any worsening symptoms.  Hope you feel better soon!

## 2023-07-08 NOTE — ED Provider Notes (Signed)
 [facility]    CSN: [id number] Arrival date & time: 07/08/23  1859      History   Chief Complaint Chief Complaint  Patient presents with   Vaginal Itching    Appt    HPI Kimberly Carney is a 33 y.o. female presents for vaginal itching.  Patient reports 1 week of vaginal discharge/itching.  Denies any dysuria, fevers, nausea/vomiting, flank pain.  No known STD exposure but would like screening while here.  Reports remote history of BV and yeast.  No OTC medications have been used since onset.  No other concerns at this time.   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 Authorizing Provider  AUVI-Q 0.3 MG/0.3ML SOAJ injection SMARTSIG:1 IM Daily 06/22/23  Yes [provider]  fluconazole (DIFLUCAN) 150 MG tablet Take 1 tablet (150 mg total) by mouth daily for 2 doses. Take 1 tablet today and you may repeat in 3 days if symptoms persist 07/08/23 07/10/23 Yes Radford Pax, NP  acyclovir (ZOVIRAX) 800 MG tablet TAKE 1 TABLET BY MOUTH DAILY 02/16/23  Yes Lenice Llamas, FNP   brompheniramine-pseudoephedrine-DM 30-2-10 MG/5ML syrup Take 5 mLs by mouth 4 (four) times daily as needed. Patient not taking: Reported on 01/21/2021 04/12/20   Joni Reining, PA-C  famotidine (PEPCID) 20 MG tablet Take 1 tablet (20 mg total) by mouth 2 (two) times daily. Patient not taking: Reported on 01/21/2021 11/30/19   Irean Hong, MD  ibuprofen (ADVIL) 600 MG tablet Take 1 tablet (600 mg total) by mouth every 8 (eight) hours as needed. Patient not taking: Reported on 01/21/2021 04/12/20   Joni Reining, PA-C  ondansetron (ZOFRAN-ODT) 4 MG disintegrating tablet Take 1 tablet (4 mg total) by mouth every 8 (eight) hours as needed for nausea or vomiting. 06/09/23   Corena Herter, MD  phenazopyridine (PYRIDIUM) 100 MG tablet Take 1 tablet (100 mg total) by mouth 3 (three) times daily as needed for pain. 10/08/22   Triplett, Rulon Eisenmenger B, FNP  tamsulosin (FLOMAX) 0.4 MG CAPS capsule Take 1 capsule (0.4 mg total) by mouth daily. 10/10/22   [name], FNP    Family History Family History  Problem Relation Age of Onset   Colon cancer Paternal Grandfather  Hypertension Paternal Grandmother    Diabetes Paternal Grandmother    Healthy Father    Healthy Mother    Healthy Brother    Diabetes Paternal Uncle    Hypertension Paternal Uncle    Kidney disease Neg Hx    Bladder Cancer Neg Hx     Social History Social History   Tobacco Use   Smoking status: Every Day    Current packs/day: 0.25    Average packs/day: 0.3 packs/day for 15.0 years (3.8 ttl pk-yrs)    Types: Cigarettes    Passive exposure: Current   Smokeless tobacco: Never  Vaping Use   Vaping status: Never Used  Substance Use Topics   Alcohol use: Yes    Alcohol/week: 6.0 standard drinks of alcohol    Types: 6 Standard drinks or equivalent per week    Comment: "on the weekends"   Drug use: Not Currently    Types: Marijuana    Comment: Last marijuana "years ago" age 66     Allergies   Patient has no known  allergies.   Review of Systems Review of Systems  Genitourinary:  Positive for vaginal discharge.     Physical Exam Triage Vital Signs ED Triage Vitals  Encounter Vitals Group     BP 07/08/23 1903 116/76     Systolic BP Percentile --      Diastolic BP Percentile --      Pulse Rate 07/08/23 1903 80     Resp 07/08/23 1903 16     Temp 07/08/23 1903 99.1 F (37.3 C)     Temp Source 07/08/23 1903 Oral     SpO2 07/08/23 1903 97 %     Weight 07/08/23 1902 130 lb (59 kg)     Height 07/08/23 1902 5\' 4"  (1.626 m)     Head Circumference --      Peak Flow --      Pain Score 07/08/23 1905 0     Pain Loc --      Pain Education --      Exclude from Growth Chart --    No data found.  Updated Vital Signs BP 116/76 (BP Location: Left Arm)   Pulse 80   Temp 99.1 F (37.3 C) (Oral)   Resp 16   Ht 5\' 4"  (1.626 m)   Wt 130 lb (59 kg)   LMP 06/08/2023 (Approximate)   SpO2 97%   BMI 22.31 kg/m   Visual Acuity Right Eye Distance:   Left Eye Distance:   Bilateral Distance:    Right Eye Near:   Left Eye Near:    Bilateral Near:     Physical Exam Vitals and nursing note reviewed.  Constitutional:      Appearance: Normal appearance.  HENT:     Head: Normocephalic and atraumatic.  Eyes:     Pupils: Pupils are equal, round, and reactive to light.  Cardiovascular:     Rate and Rhythm: Normal rate.  Pulmonary:     Effort: Pulmonary effort is normal.  Skin:    General: Skin is warm and dry.  Neurological:     General: No focal deficit present.     Mental Status: She is alert and oriented to person, place, and time.  Psychiatric:        Mood and Affect: Mood normal.        Behavior: Behavior normal.      UC Treatments / Results  Labs (all labs ordered are listed, but only abnormal results are  displayed) Labs Reviewed  RPR  HIV ANTIBODY (ROUTINE TESTING W REFLEX)  CERVICOVAGINAL ANCILLARY ONLY    EKG   Radiology No results found.  Procedures Procedures  (including critical care time)  Medications Ordered in UC Medications - No data to display  Initial Impression / Assessment and Plan / UC Course  I have reviewed the triage vital signs and the nursing notes.  Pertinent labs & imaging results that were available during my care of the patient were reviewed by me and considered in my medical decision making (see chart for details).     Exam and symptoms with patient.  No red flags.  Vaginal swab/STD testing is ordered and will contact for any positive results.  Will start Diflucan based on symptoms.  Advised PCP or GYN follow-up if symptoms do not improve.  ER precautions reviewed. Final Clinical Impressions(s) / UC Diagnoses   Final diagnoses:  Acute vaginitis  Screening examination for STD (sexually transmitted disease)     Discharge Instructions      Start Diflucan as prescribed.  The clinical contact you with results of the testing done today if positive.  Follow-up with your PCP or gynecologist if symptoms do not improve.  Please go to the ER for any worsening symptoms.  Hope you feel better soon!    ED Prescriptions     Medication Sig Dispense Auth. Provider   fluconazole (DIFLUCAN) 150 MG tablet Take 1 tablet (150 mg total) by mouth daily for 2 doses. Take 1 tablet today and you may repeat in 3 days if symptoms persist 2 tablet Radford Pax, NP      PDMP not reviewed this encounter.   Radford Pax, NP 07/08/23 1921

## 2023-07-09 ENCOUNTER — Telehealth (HOSPITAL_COMMUNITY): Payer: Self-pay

## 2023-07-09 LAB — CERVICOVAGINAL ANCILLARY ONLY
Bacterial Vaginitis (gardnerella): POSITIVE — AB
Candida Glabrata: NEGATIVE
Candida Vaginitis: POSITIVE — AB
Chlamydia: NEGATIVE
Comment: NEGATIVE
Comment: NEGATIVE
Comment: NEGATIVE
Comment: NEGATIVE
Comment: NEGATIVE
Comment: NORMAL
Neisseria Gonorrhea: NEGATIVE
Trichomonas: NEGATIVE

## 2023-07-09 LAB — RPR: RPR Ser Ql: NONREACTIVE

## 2023-07-09 MED ORDER — METRONIDAZOLE 500 MG PO TABS: 500.0000 mg | ORAL_TABLET | Freq: Two times a day (BID) | ORAL | 0 refills | Status: AC

## 2023-07-09 NOTE — Telephone Encounter (Signed)
 Per protocol, pt requires tx with metronidazole. Rx sent to pharmacy on file.

## 2023-10-03 ENCOUNTER — Other Ambulatory Visit: Payer: Self-pay | Admitting: Family Medicine

## 2023-10-03 DIAGNOSIS — B009 Herpesviral infection, unspecified: Secondary | ICD-10-CM

## 2023-10-28 ENCOUNTER — Telehealth: Payer: Self-pay | Admitting: Family Medicine

## 2023-10-28 DIAGNOSIS — B009 Herpesviral infection, unspecified: Secondary | ICD-10-CM

## 2023-10-28 MED ORDER — ACYCLOVIR 400 MG PO TABS: 400.0000 mg | ORAL_TABLET | Freq: Two times a day (BID) | ORAL | 3 refills | Status: AC

## 2023-10-28 NOTE — Assessment & Plan Note (Signed)
 Rx acyclovir  400 mg BID for suppression.

## 2023-10-28 NOTE — Telephone Encounter (Signed)
 Called patient to clarify. Was previously prescribed acyclovir  800 mg daily for suppression. Just called patient to clarify - she is okay with changing the dose to 400 mg BID for proper suppression dosing.   Kimberly Helling, MD 10/28/23  2:15 PM
